# Patient Record
Sex: Male | Born: 1975 | Race: White | Hispanic: No | Marital: Married | Smoking: Current every day smoker
Health system: Southern US, Community
[De-identification: ages and names within clinical notes are randomized; demographics above are authoritative.]

## PROBLEM LIST (undated history)

## (undated) DIAGNOSIS — S065XAA Traumatic subdural hemorrhage with loss of consciousness status unknown, initial encounter: Secondary | ICD-10-CM

## (undated) DIAGNOSIS — M869 Osteomyelitis, unspecified: Secondary | ICD-10-CM

## (undated) DIAGNOSIS — S065X9A Traumatic subdural hemorrhage with loss of consciousness of unspecified duration, initial encounter: Secondary | ICD-10-CM

## (undated) DIAGNOSIS — F988 Other specified behavioral and emotional disorders with onset usually occurring in childhood and adolescence: Secondary | ICD-10-CM

## (undated) HISTORY — DX: Other specified behavioral and emotional disorders with onset usually occurring in childhood and adolescence: F98.8

## (undated) HISTORY — DX: Osteomyelitis, unspecified: M86.9

## (undated) HISTORY — DX: Traumatic subdural hemorrhage with loss of consciousness status unknown, initial encounter: S06.5XAA

## (undated) HISTORY — PX: ANTERIOR CRUCIATE LIGAMENT REPAIR: SHX115

## (undated) HISTORY — DX: Traumatic subdural hemorrhage with loss of consciousness of unspecified duration, initial encounter: S06.5X9A

---

## 2004-04-13 ENCOUNTER — Ambulatory Visit: Payer: Self-pay | Admitting: Family Medicine

## 2004-05-26 ENCOUNTER — Ambulatory Visit: Payer: Self-pay | Admitting: Family Medicine

## 2004-06-15 ENCOUNTER — Ambulatory Visit: Payer: Self-pay | Admitting: Family Medicine

## 2004-09-22 ENCOUNTER — Ambulatory Visit (HOSPITAL_COMMUNITY): Admission: RE | Admit: 2004-09-22 | Discharge: 2004-09-22 | Payer: Self-pay | Admitting: Orthopedic Surgery

## 2006-06-26 ENCOUNTER — Ambulatory Visit: Payer: Self-pay | Admitting: Family Medicine

## 2006-10-21 DIAGNOSIS — J45909 Unspecified asthma, uncomplicated: Secondary | ICD-10-CM | POA: Insufficient documentation

## 2006-11-07 ENCOUNTER — Ambulatory Visit: Payer: Self-pay | Admitting: Family Medicine

## 2006-11-07 DIAGNOSIS — F988 Other specified behavioral and emotional disorders with onset usually occurring in childhood and adolescence: Secondary | ICD-10-CM | POA: Insufficient documentation

## 2006-11-07 DIAGNOSIS — F17200 Nicotine dependence, unspecified, uncomplicated: Secondary | ICD-10-CM | POA: Insufficient documentation

## 2006-12-23 ENCOUNTER — Ambulatory Visit: Payer: Self-pay | Admitting: Family Medicine

## 2007-01-14 ENCOUNTER — Ambulatory Visit: Payer: Self-pay | Admitting: Family Medicine

## 2007-01-14 DIAGNOSIS — K602 Anal fissure, unspecified: Secondary | ICD-10-CM

## 2007-02-12 ENCOUNTER — Emergency Department (HOSPITAL_COMMUNITY): Admission: EM | Admit: 2007-02-12 | Discharge: 2007-02-12 | Payer: Self-pay | Admitting: Family Medicine

## 2007-02-18 ENCOUNTER — Ambulatory Visit: Payer: Self-pay | Admitting: Family Medicine

## 2007-02-19 DIAGNOSIS — S92209A Fracture of unspecified tarsal bone(s) of unspecified foot, initial encounter for closed fracture: Secondary | ICD-10-CM

## 2007-02-19 DIAGNOSIS — S92309A Fracture of unspecified metatarsal bone(s), unspecified foot, initial encounter for closed fracture: Secondary | ICD-10-CM

## 2007-04-07 ENCOUNTER — Telehealth (INDEPENDENT_AMBULATORY_CARE_PROVIDER_SITE_OTHER): Payer: Self-pay | Admitting: *Deleted

## 2007-04-28 ENCOUNTER — Telehealth: Payer: Self-pay | Admitting: Family Medicine

## 2007-07-24 ENCOUNTER — Telehealth: Payer: Self-pay | Admitting: *Deleted

## 2007-07-26 DIAGNOSIS — M549 Dorsalgia, unspecified: Secondary | ICD-10-CM | POA: Insufficient documentation

## 2007-07-28 ENCOUNTER — Ambulatory Visit: Payer: Self-pay | Admitting: Family Medicine

## 2008-02-17 ENCOUNTER — Ambulatory Visit: Payer: Self-pay | Admitting: Family Medicine

## 2008-02-23 ENCOUNTER — Ambulatory Visit: Payer: Self-pay | Admitting: Family Medicine

## 2008-07-16 ENCOUNTER — Ambulatory Visit: Payer: Self-pay | Admitting: Family Medicine

## 2008-09-17 ENCOUNTER — Ambulatory Visit: Payer: Self-pay | Admitting: Family Medicine

## 2008-12-13 ENCOUNTER — Telehealth: Payer: Self-pay | Admitting: Internal Medicine

## 2009-02-10 ENCOUNTER — Telehealth: Payer: Self-pay | Admitting: Family Medicine

## 2009-03-04 ENCOUNTER — Telehealth: Payer: Self-pay | Admitting: Family Medicine

## 2009-04-07 ENCOUNTER — Telehealth (INDEPENDENT_AMBULATORY_CARE_PROVIDER_SITE_OTHER): Payer: Self-pay | Admitting: *Deleted

## 2009-04-08 ENCOUNTER — Encounter: Payer: Self-pay | Admitting: Family Medicine

## 2009-05-31 ENCOUNTER — Telehealth: Payer: Self-pay | Admitting: Family Medicine

## 2009-06-29 ENCOUNTER — Telehealth: Payer: Self-pay | Admitting: Family Medicine

## 2009-08-25 ENCOUNTER — Telehealth: Payer: Self-pay | Admitting: Family Medicine
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
## 2009-11-18 ENCOUNTER — Ambulatory Visit: Payer: Self-pay | Admitting: Family Medicine

## 2009-11-18 DIAGNOSIS — M545 Low back pain: Secondary | ICD-10-CM | POA: Insufficient documentation

## 2010-02-23 ENCOUNTER — Telehealth: Payer: Self-pay | Admitting: Family Medicine

## 2010-03-21 NOTE — Medication Information (Signed)
Summary: Prior Authorization Request and Approval for Dextroamp-Amphet  Prior Authorization Request and Approval for Dextroamp-Amphet   Imported By: Maryln Gottron 04/12/2009 13:36:26  _____________________________________________________________________  External Attachment:    Type:   Image     Comment:   External Document

## 2010-03-21 NOTE — Progress Notes (Signed)
Summary: pt req refills of Adderall XR and Methylphenidate  Phone Note Refill Request Call back at Home Phone [phone]   Refills Requested: Medication #1:  ADDERALL XR 20 MG XR24H-CAP Take 1 tablet by mouth every morning   Dosage confirmed as above?Dosage Confirmed   Supply Requested: 3 months  Medication #2:  METHYLPHENIDATE HCL 10 MG  TABS take one tab at noon and one tab at 4pm   Dosage confirmed as above?Dosage Confirmed   Supply Requested: 3 months  Method Requested: Pick up at Office Initial call taken by: Lucy Antigua,  August 25, 2009 1:48 PM    Prescriptions: ADDERALL XR 20 MG XR24H-CAP (AMPHETAMINE-DEXTROAMPHETAMINE) take one tab every morning fill in two months  #30 x 0   Entered by:   Kern Reap CMA (AAMA)   Authorized by:   Roderick Pee MD   Signed by:   Kern Reap CMA (AAMA) on 08/25/2009   Method used:   Print then Give to Patient   RxID:   [id number] ADDERALL XR 20 MG XR24H-CAP (AMPHETAMINE-DEXTROAMPHETAMINE) take one tab every morning  fill in one month  #30 x 0   Entered by:   Kern Reap CMA (AAMA)   Authorized by:   Roderick Pee MD   Signed by:   Kern Reap CMA (AAMA) on 08/25/2009   Method used:   Print then Give to Patient   RxID:   [id number] ADDERALL XR 20 MG XR24H-CAP (AMPHETAMINE-DEXTROAMPHETAMINE) Take 1 tablet by mouth every morning  #30 x 0   Entered by:   Kern Reap CMA (AAMA)   Authorized by:   Roderick Pee MD   Signed by:   Kern Reap CMA (AAMA) on 08/25/2009   Method used:   Print then Give to Patient   RxID:   [id number] METHYLPHENIDATE HCL 10 MG TABS (METHYLPHENIDATE HCL) take one tab at 12 and one tab at 4pm fill in two months  #60 x 0   Entered by:   Kern Reap CMA (AAMA)   Authorized by:   Roderick Pee MD   Signed by:   Kern Reap CMA (AAMA) on 08/25/2009   Method used:   Print then Give to Patient   RxID:   [id number] METHYLPHENIDATE HCL 10 MG TABS (METHYLPHENIDATE HCL)  take one tab at noon and one tab at 4pm  fill in one month  #60 x 0   Entered by:   Kern Reap CMA (AAMA)   Authorized by:   Roderick Pee MD   Signed by:   Kern Reap CMA (AAMA) on 08/25/2009   Method used:   Print then Give to Patient   RxID:   [id number] METHYLPHENIDATE HCL 10 MG  TABS (METHYLPHENIDATE HCL) take one tab at noon and one tab at 4pm  #60 x 0   Entered by:   Kern Reap CMA (AAMA)   Authorized by:   Roderick Pee MD   Signed by:   Kern Reap CMA (AAMA) on 08/25/2009   Method used:   Print then Give to Patient   RxID:   [id number]

## 2010-03-21 NOTE — Progress Notes (Signed)
Summary: REQ FOR REFILL ON MED   Phone Note Call from Patient   Caller: Patient    [phone] Reason for Call: Refill Medication Summary of Call: Pt called in to req a refill on meds:  METHYLPHENIDATE HCL 10 MG  TABS  -and-   ADDERALL XR 20 MG ..... Pt adv that he can be reached at [phone] when same is ready.  Initial call taken by: Debbra Riding,  May 31, 2009 4:47 PM  Follow-up for Phone Call        rx is ready for pick up Follow-up by: Kern Reap CMA Duncan Dull),  June 01, 2009 8:42 AM    Prescriptions: ADDERALL XR 20 MG XR24H-CAP (AMPHETAMINE-DEXTROAMPHETAMINE) take one tab every morning fill in two months  #30 x 0   Entered by:   Kern Reap CMA (AAMA)   Authorized by:   Roderick Pee MD   Signed by:   Kern Reap CMA (AAMA) on 05/31/2009   Method used:   Print then Give to Patient   RxID:   [id number] ADDERALL XR 20 MG XR24H-CAP (AMPHETAMINE-DEXTROAMPHETAMINE) take one tab every morning  fill in one month  #30 x 0   Entered by:   Kern Reap CMA (AAMA)   Authorized by:   Roderick Pee MD   Signed by:   Kern Reap CMA (AAMA) on 05/31/2009   Method used:   Print then Give to Patient   RxID:   [id number] ADDERALL XR 20 MG XR24H-CAP (AMPHETAMINE-DEXTROAMPHETAMINE) Take 1 tablet by mouth every morning  #30 x 0   Entered by:   Kern Reap CMA (AAMA)   Authorized by:   Roderick Pee MD   Signed by:   Kern Reap CMA (AAMA) on 05/31/2009   Method used:   Print then Give to Patient   RxID:   [id number] METHYLPHENIDATE HCL 10 MG TABS (METHYLPHENIDATE HCL) take one tab at 12 and one tab at 4pm fill in two months  #60 x 0   Entered by:   Kern Reap CMA (AAMA)   Authorized by:   Roderick Pee MD   Signed by:   Kern Reap CMA (AAMA) on 05/31/2009   Method used:   Print then Give to Patient   RxID:   [id number] METHYLPHENIDATE HCL 10 MG TABS (METHYLPHENIDATE HCL) take one tab at noon and one tab at 4pm  fill in one month  #60 x  0   Entered by:   Kern Reap CMA (AAMA)   Authorized by:   Roderick Pee MD   Signed by:   Kern Reap CMA (AAMA) on 05/31/2009   Method used:   Print then Give to Patient   RxID:   [id number] METHYLPHENIDATE HCL 10 MG  TABS (METHYLPHENIDATE HCL) take one tab at noon and one tab at 4pm  #60 x 0   Entered by:   Kern Reap CMA (AAMA)   Authorized by:   Roderick Pee MD   Signed by:   Kern Reap CMA (AAMA) on 05/31/2009   Method used:   Print then Give to Patient   RxID:   [id number]

## 2010-03-21 NOTE — Progress Notes (Signed)
Summary: REFILL ON MEDS  Phone Note Call from Patient   Caller: Patient [phone] Reason for Call: Refill Medication Summary of Call: Pt called to req a refill on meds: Methylphenidate Hcl 10 Mg Tabs  and  Adderall Xr 20 Mg.... Pt adv that he can be reached at [phone] when same is ready for p  Initial call taken by: Debbra Riding,  March 04, 2009 12:25 PM  Follow-up for Phone Call        rx ready for pick up.  patient is aware Follow-up by: Kern Reap CMA Duncan Dull),  March 07, 2009 12:28 PM    New/Updated Medications: METHYLPHENIDATE HCL 10 MG  TABS (METHYLPHENIDATE HCL) take one tab at noon and one tab at 4pm METHYLPHENIDATE HCL 10 MG TABS (METHYLPHENIDATE HCL) take one tab at noon and one tab at 4pm  fill in one month METHYLPHENIDATE HCL 10 MG TABS (METHYLPHENIDATE HCL) take one tab at 12 and one tab at 4pm fill in two months ADDERALL XR 20 MG XR24H-CAP (AMPHETAMINE-DEXTROAMPHETAMINE) take one tab every morning  fill in one month ADDERALL XR 20 MG XR24H-CAP (AMPHETAMINE-DEXTROAMPHETAMINE) take one tab every morning fill in two months Prescriptions: ADDERALL XR 20 MG XR24H-CAP (AMPHETAMINE-DEXTROAMPHETAMINE) take one tab every morning fill in two months  #30 x 0   Entered by:   Kern Reap CMA (AAMA)   Authorized by:   Roderick Pee MD   Signed by:   Kern Reap CMA (AAMA) on 03/07/2009   Method used:   Print then Give to Patient   RxID:   [id number] ADDERALL XR 20 MG XR24H-CAP (AMPHETAMINE-DEXTROAMPHETAMINE) take one tab every morning  fill in one month  #30 x 0   Entered by:   Kern Reap CMA (AAMA)   Authorized by:   Roderick Pee MD   Signed by:   Kern Reap CMA (AAMA) on 03/07/2009   Method used:   Print then Give to Patient   RxID:   [id number] ADDERALL XR 20 MG XR24H-CAP (AMPHETAMINE-DEXTROAMPHETAMINE) Take 1 tablet by mouth every morning  #30 x 0   Entered by:   Kern Reap CMA (AAMA)   Authorized by:   Roderick Pee MD   Signed  by:   Kern Reap CMA (AAMA) on 03/07/2009   Method used:   Print then Give to Patient   RxID:   [id number] METHYLPHENIDATE HCL 10 MG TABS (METHYLPHENIDATE HCL) take one tab at 12 and one tab at 4pm fill in two months  #60 x 0   Entered by:   Kern Reap CMA (AAMA)   Authorized by:   Roderick Pee MD   Signed by:   Kern Reap CMA (AAMA) on 03/07/2009   Method used:   Print then Give to Patient   RxID:   [id number] METHYLPHENIDATE HCL 10 MG TABS (METHYLPHENIDATE HCL) take one tab at noon and one tab at 4pm  fill in one month  #60 x 0   Entered by:   Kern Reap CMA (AAMA)   Authorized by:   Roderick Pee MD   Signed by:   Kern Reap CMA (AAMA) on 03/07/2009   Method used:   Print then Give to Patient   RxID:   [id number] METHYLPHENIDATE HCL 10 MG  TABS (METHYLPHENIDATE HCL) take one tab at noon and one tab at 4pm  #60 x 0   Entered by:   Kern Reap CMA (AAMA)   Authorized by:   Roderick Pee MD  Signed by:   Kern Reap CMA (AAMA) on 03/07/2009   Method used:   Print then Give to Patient   RxID:   [id number]

## 2010-03-21 NOTE — Progress Notes (Signed)
Summary: PRIOR AUTHORIZATION  Phone Note Call from Patient   Caller: Patient [phone] Reason for Call: Refill Medication Summary of Call: Pt called to check on status of Prior Authorization for his meds :  Methylphenidate Hcl 10 Mg Tabs  and  Adderall Xr 20 Mg..... Pt adv that he checked with [employer] and they are telling him that they faxed the form to [facility] but they have not had any response ref to same...Marland KitchenMarland Kitchen Pt adv that he is going out of town and would like to p/u RX prior to departure, if possible.   Initial call taken by: Debbra Riding,  April 07, 2009 9:38 AM  Follow-up for Phone Call        No forms on this pt in Triage .  Does Dr. Tawanna Cooler have them?  Please have pt ask to have forms resent. Follow-up by: Lynann Beaver CMA,  April 07, 2009 10:00 AM  Additional Follow-up for Phone Call Additional follow up Details #1::        Phone Call Completed----Forms resent via fax.... Received and placed in Triage folder for delivery.  Additional Follow-up by: Debbra Riding,  April 07, 2009 11:01 AM     Appended Document: PRIOR AUTHORIZATION PT IS AWARE PA HAS BEEN APPROVED UNTIL 04-07-2010 [facility] IS ALSO AWARE.

## 2010-03-21 NOTE — Assessment & Plan Note (Signed)
Summary: MED CK / REFILL // RS   Vital Signs:  Patient profile:   35 year old male Height:      74 inches Weight:      210 pounds BMI:     27.06 Temp:     98.2 degrees F oral BP sitting:   120 / 80  (left arm) Cuff size:   regular  Vitals Entered By: Kern Reap CMA Duncan Dull) (November 18, 2009 11:30 AM) CC: follow-up visit Is Patient Diabetic? No   CC:  follow-up visit.  History of Present Illness:  Todd Cobb is a 35 year old male, smoker, the comes in today for evaluation of ADD/.  Low back pain.  For ADD.  He takes Adderall 20 milligrams long-acting in the morning and 10 mg at noon of the methylphenidate and 10 mg at 4 p.m. with this combination.  He functions fairly normally.  He also has a history of chronic low back pain.  We x-rayed his back about 15 months ago x-rays were negative.  His back pain comes and goes.  He continues to use an occasional Flexeril and Vicodin at bedtime.  I explained since he was having recurrent pain we would need to get him involved in a formal physical therapy program.  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  Past Medical History: Reviewed history from 01/14/2007 and no changes required. 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 Family History Diabetes 1st degree relative Family History High cholesterol Family History Hypertension  Social History: Reviewed history from 07/16/2008 and no changes required. Married Current Smoker Alcohol use-no moved home with mom and dad  Review of Systems      See HPI  Physical Exam  General:  Well-developed,well-nourished,in no acute distress;  alert,appropriate and cooperative throughout examination Psych:  Cognition and judgment appear intact. Alert and cooperative with normal attention span and concentration. No apparent delusions, illusions, hallucinations   Impression & Recommendations:  Problem # 1:  ADD (ICD-314.00) Assessment Improved  Problem # 2:  LOW BACK PAIN, CHRONIC (ICD-724.2) Assessment: Unchanged  His updated medication list for this problem includes:    Hydrocodone-acetaminophen 7.5-750 Mg Tabs (Hydrocodone-acetaminophen) .Marland Kitchen... Take one tab at bedtime    Flexeril 10 Mg Tabs (Cyclobenzaprine hcl) .Marland Kitchen... Take one tab by mouth at bedtime  Orders: Physical Therapy Referral (PT)  Complete Medication List: 1)  Methylphenidate Hcl 10 Mg Tabs (Methylphenidate hcl) .... Take one tab at noon and one tab at 4pm 2)  Adderall Xr 20 Mg Xr24h-cap (Amphetamine-dextroamphetamine) .... Take 1 tablet by mouth every morning 3)  Hydrocodone-acetaminophen 7.5-750 Mg Tabs (Hydrocodone-acetaminophen) .... Take one tab at bedtime 4)  Methylphenidate Hcl 10 Mg Tabs (Methylphenidate hcl) .... Take one tab at noon and one tab at 4pm  fill in one month 5)  Methylphenidate Hcl 10 Mg Tabs (Methylphenidate hcl) .... Take one tab at 12 and one tab at 4pm fill in two months 6)  Adderall Xr 20 Mg Xr24h-cap (Amphetamine-dextroamphetamine) .... Take one tab every morning  fill in one month 7)  Adderall Xr 20 Mg Xr24h-cap (Amphetamine-dextroamphetamine) .... Take one tab every morning fill in two months 8)  Flexeril 10 Mg Tabs (Cyclobenzaprine hcl) .... Take one  tab by mouth at bedtime  Patient Instructions: 1)  continue the long-acting 20-mg Adderall in the morning, and the 10 mg of methylphenidate twice daily. 2)  We will   set up for a physical therapy consult to evaluate the low back pain.  It can take a half of Flexeril and a half of Vicodin at bedtime as needed for severe pain.  Also Motrin 600 mg twice daily with food 3)  Please schedule  a follow-up appointment in 1 year. Prescriptions: FLEXERIL 10 MG TABS (CYCLOBENZAPRINE HCL) take one tab by mouth at bedtime  #30 x 1   Entered and Authorized by:   Roderick Pee MD   Signed by:   Roderick Pee MD on 11/18/2009   Method used:   Print then Give to Patient   RxID:   [id number] ADDERALL XR 20 MG XR24H-CAP (AMPHETAMINE-DEXTROAMPHETAMINE) take one tab every morning fill in two months  #30 x 0   Entered and Authorized by:   Roderick Pee MD   Signed by:   Roderick Pee MD on 11/18/2009   Method used:   Print then Give to Patient   RxID:   [id number] ADDERALL XR 20 MG XR24H-CAP (AMPHETAMINE-DEXTROAMPHETAMINE) take one tab every morning  fill in one month  #30 x 0   Entered and Authorized by:   Roderick Pee MD   Signed by:   Roderick Pee MD on 11/18/2009   Method used:   Print then Give to Patient   RxID:   [id number] METHYLPHENIDATE HCL 10 MG TABS (METHYLPHENIDATE HCL) take one tab at 12 and one tab at 4pm fill in two months  #60 x 0   Entered and Authorized by:   Roderick Pee MD   Signed by:   Roderick Pee MD on 11/18/2009   Method used:   Print then Give to Patient   RxID:   [id number] METHYLPHENIDATE HCL 10 MG TABS (METHYLPHENIDATE HCL) take one tab at noon and one tab at 4pm  fill in one month  #60 x 0   Entered and Authorized by:   Roderick Pee MD   Signed by:   Roderick Pee MD on 11/18/2009   Method used:   Print then Give to Patient   RxID:   [id number] HYDROCODONE-ACETAMINOPHEN 7.5-750 MG TABS (HYDROCODONE-ACETAMINOPHEN) take one tab at bedtime  #30 x 1   Entered and Authorized by:   Roderick Pee MD   Signed by:   Roderick Pee MD on 11/18/2009   Method used:   Print then Give to Patient   RxID:   [id number] ADDERALL XR 20 MG XR24H-CAP (AMPHETAMINE-DEXTROAMPHETAMINE) Take 1 tablet by mouth every morning  #30 x 0   Entered and Authorized by:   Roderick Pee MD   Signed by:   Roderick Pee MD on 11/18/2009    Method used:   Print then Give to Patient   RxID:   [id number] METHYLPHENIDATE HCL 10 MG  TABS (METHYLPHENIDATE HCL) take one tab at noon and one tab at 4pm  #60 x 0   Entered and Authorized by:   Roderick Pee MD   Signed by:   Roderick Pee MD on 11/18/2009   Method used:   Print then Give to Patient   RxID:   [id number]

## 2010-03-21 NOTE — Progress Notes (Signed)
Summary: REQ FOR REFILL (Hydrocodone-Acetaminophen)  Phone Note Refill Request Message from:  Fax from Pharmacy on Jun 29, 2009 11:49 AM  Refills Requested: Medication #1:  HYDROCODONE-ACETAMINOPHEN 7.5-750 MG TABS take one tab at bedtime   Notes: Qty #30 - [facility] [address], [place] Initial call taken by: Kern Reap CMA Duncan Dull),  Jun 29, 2009 12:21 PM  Follow-up for Phone Call        Vicodin ES dispense 30 tablets, directions one nightly p.r.n. for severe back pain, refills x 1 Follow-up by: Roderick Pee MD,  Jun 29, 2009 12:15 PM    Prescriptions: HYDROCODONE-ACETAMINOPHEN 7.5-750 MG TABS (HYDROCODONE-ACETAMINOPHEN) take one tab at bedtime  #30 x 1   Entered by:   Kern Reap CMA (AAMA)   Authorized by:   Roderick Pee MD   Signed by:   Kern Reap CMA (AAMA) on 06/29/2009   Method used:   Telephoned to ...       [facility] [phone]* (retail)       [address] [place]       [place], [place]  [address]       Ph: [phone] or [phone]       Fax: [phone]   RxID:   [id number]

## 2010-03-23 NOTE — Progress Notes (Signed)
Summary: new rx  Phone Note Refill Request Call back at Home Phone [phone] Message from:  Patient  Refills Requested: Medication #1:  ADDERALL XR 20 MG XR24H-CAP Take 1 tablet by mouth every morning  Medication #2:  METHYLPHENIDATE HCL 10 MG  TABS take one tab at noon and one tab at 4pm pt needs new rxs  Initial call taken by: Heron Sabins,  February 23, 2010 12:43 PM    Prescriptions: METHYLPHENIDATE HCL 10 MG  TABS (METHYLPHENIDATE HCL) take one tab at noon and one tab at 4pm  #60 x 0   Entered by:   Kern Reap CMA (AAMA)   Authorized by:   Roderick Pee MD   Signed by:   Kern Reap CMA (AAMA) on 02/24/2010   Method used:   Print then Give to Patient   RxID:   [id number] METHYLPHENIDATE HCL 10 MG TABS (METHYLPHENIDATE HCL) take one tab at 12 and one tab at 4pm fill in two months  #60 x 0   Entered by:   Kern Reap CMA (AAMA)   Authorized by:   Roderick Pee MD   Signed by:   Kern Reap CMA (AAMA) on 02/24/2010   Method used:   Print then Give to Patient   RxID:   [id number] METHYLPHENIDATE HCL 10 MG TABS (METHYLPHENIDATE HCL) take one tab at noon and one tab at 4pm  fill in one month  #60 x 0   Entered by:   Kern Reap CMA (AAMA)   Authorized by:   Roderick Pee MD   Signed by:   Kern Reap CMA (AAMA) on 02/24/2010   Method used:   Print then Give to Patient   RxID:   [id number] ADDERALL XR 20 MG XR24H-CAP (AMPHETAMINE-DEXTROAMPHETAMINE) take one tab every morning fill in two months  #30 x 0   Entered by:   Kern Reap CMA (AAMA)   Authorized by:   Roderick Pee MD   Signed by:   Kern Reap CMA (AAMA) on 02/24/2010   Method used:   Print then Give to Patient   RxID:   [id number] ADDERALL XR 20 MG XR24H-CAP (AMPHETAMINE-DEXTROAMPHETAMINE) take one tab every morning  fill in one month  #30 x 0   Entered by:   Kern Reap CMA (AAMA)   Authorized by:   Roderick Pee MD   Signed by:   Kern Reap CMA (AAMA) on  02/24/2010   Method used:   Print then Give to Patient   RxID:   [id number] ADDERALL XR 20 MG XR24H-CAP (AMPHETAMINE-DEXTROAMPHETAMINE) Take 1 tablet by mouth every morning  #30 x 0   Entered by:   Kern Reap CMA (AAMA)   Authorized by:   Roderick Pee MD   Signed by:   Kern Reap CMA (AAMA) on 02/24/2010   Method used:   Print then Give to Patient   RxID:   [id number]

## 2010-05-12 ENCOUNTER — Telehealth: Payer: Self-pay | Admitting: *Deleted

## 2010-05-12 NOTE — Telephone Encounter (Signed)
patient  Is calling for refill of adderall and ritalin

## 2010-05-15 MED ORDER — METHYLPHENIDATE HCL 10 MG PO TABS: 10.0000 mg | ORAL_TABLET | Freq: Two times a day (BID) | ORAL | Status: DC

## 2010-05-15 MED ORDER — AMPHETAMINE-DEXTROAMPHET ER 20 MG PO CP24: 20.0000 mg | ORAL_CAPSULE | ORAL | Status: DC

## 2010-08-09 ENCOUNTER — Telehealth: Payer: Self-pay | Admitting: *Deleted

## 2010-08-09 MED ORDER — AMPHETAMINE-DEXTROAMPHET ER 20 MG PO CP24: 20.0000 mg | ORAL_CAPSULE | ORAL | Status: DC

## 2010-08-09 MED ORDER — METHYLPHENIDATE HCL 10 MG PO TABS: 10.0000 mg | ORAL_TABLET | Freq: Two times a day (BID) | ORAL | Status: DC

## 2010-08-09 NOTE — Telephone Encounter (Signed)
Rx refill of adderall and methylphenidate (dosage not given).  Please call when ready for pick up.

## 2010-08-09 NOTE — Telephone Encounter (Signed)
Left message on machine rx ready for pick up  

## 2010-09-28 ENCOUNTER — Ambulatory Visit (INDEPENDENT_AMBULATORY_CARE_PROVIDER_SITE_OTHER): Payer: BC Managed Care – PPO | Admitting: Family Medicine

## 2010-09-28 ENCOUNTER — Encounter: Payer: Self-pay | Admitting: Family Medicine

## 2010-09-28 VITALS — BP 120/78 | Temp 98.2°F | Ht 72.0 in | Wt 224.0 lb

## 2010-09-28 DIAGNOSIS — F988 Other specified behavioral and emotional disorders with onset usually occurring in childhood and adolescence: Secondary | ICD-10-CM

## 2010-09-28 MED ORDER — METHYLPHENIDATE HCL 10 MG PO TABS: 10.0000 mg | ORAL_TABLET | Freq: Two times a day (BID) | ORAL | Status: DC

## 2010-09-28 MED ORDER — AMPHETAMINE-DEXTROAMPHET ER 20 MG PO CP24: 20.0000 mg | ORAL_CAPSULE | ORAL | Status: DC

## 2010-09-28 MED ORDER — METHYLPHENIDATE HCL 10 MG PO TABS: ORAL_TABLET | ORAL | Status: DC

## 2010-09-28 NOTE — Progress Notes (Signed)
  Subjective:    Patient ID: Todd Cobb, male    DOB: 11-Nov-1975, 35 y.o.   MRN: [id number]  Todd Cobb is a 35 year old, married male, smoker, who comes in today to renew his prescriptions for ADD.  There was a change in insurance company and there was a gap in getting his medication failures currently out of his medications.  He takes a long-acting Adderall 20 mg daily, along with 10 mg of plain Ritalin b.i.d.  With this medication is able to function at work.    Review of Systems    General and psychiatric review of systems otherwise negative Objective:   Physical Exam Well-developed well-nourished, male in no acute distress       Assessment & Plan:  ADD renew medications

## 2010-09-28 NOTE — Patient Instructions (Signed)
Continued the 20-mg long-acting tablet in the morning, and the 10-mg tablet twice daily.  When your two weeks from being out of your third prescription call for refills

## 2010-11-16 ENCOUNTER — Telehealth: Payer: Self-pay | Admitting: Family Medicine

## 2010-11-16 NOTE — Telephone Encounter (Signed)
Refill Adderall and Methyphenidate. Thanks.

## 2010-11-17 NOTE — Telephone Encounter (Signed)
Pt walked in to check on the status of his 2 rxs. i explained to him that Dr Tawanna Cooler and Fleet Contras are both out and we will try to get it refilled on Monday. Thanks.

## 2010-11-22 NOTE — Telephone Encounter (Signed)
Left message on machine for patient  To return our call. Last fill for adderall xr 20 and adderall 10 was 11/18/10 .

## 2010-11-23 NOTE — Telephone Encounter (Signed)
patient  Is aware 

## 2010-12-13 ENCOUNTER — Telehealth: Payer: Self-pay | Admitting: *Deleted

## 2010-12-13 DIAGNOSIS — F988 Other specified behavioral and emotional disorders with onset usually occurring in childhood and adolescence: Secondary | ICD-10-CM

## 2010-12-13 NOTE — Telephone Encounter (Signed)
patient  Is calling because he would like an early refill for his Rialin and Adderall because he is going out of town.

## 2010-12-14 MED ORDER — METHYLPHENIDATE HCL 10 MG PO TABS: 10.0000 mg | ORAL_TABLET | Freq: Two times a day (BID) | ORAL | Status: DC

## 2010-12-14 MED ORDER — AMPHETAMINE-DEXTROAMPHET ER 20 MG PO CP24: 20.0000 mg | ORAL_CAPSULE | Freq: Two times a day (BID) | ORAL | Status: DC

## 2010-12-14 MED ORDER — AMPHETAMINE-DEXTROAMPHET ER 20 MG PO CP24: 20.0000 mg | ORAL_CAPSULE | ORAL | Status: DC

## 2010-12-14 NOTE — Telephone Encounter (Signed)
ok 

## 2010-12-14 NOTE — Telephone Encounter (Signed)
rx ready for pick up and patient is aware  

## 2011-02-02 ENCOUNTER — Encounter: Payer: Self-pay | Admitting: Family Medicine

## 2011-02-02 ENCOUNTER — Ambulatory Visit (INDEPENDENT_AMBULATORY_CARE_PROVIDER_SITE_OTHER): Payer: BC Managed Care – PPO | Admitting: Family Medicine

## 2011-02-02 DIAGNOSIS — J029 Acute pharyngitis, unspecified: Secondary | ICD-10-CM

## 2011-02-02 DIAGNOSIS — J069 Acute upper respiratory infection, unspecified: Secondary | ICD-10-CM

## 2011-02-02 DIAGNOSIS — F17200 Nicotine dependence, unspecified, uncomplicated: Secondary | ICD-10-CM

## 2011-02-02 MED ORDER — HYDROCODONE-HOMATROPINE 5-1.5 MG/5ML PO SYRP: ORAL_SOLUTION | ORAL | Status: DC

## 2011-02-02 MED ORDER — VARENICLINE TARTRATE 1 MG PO TABS: ORAL_TABLET | ORAL | Status: DC

## 2011-02-02 NOTE — Patient Instructions (Signed)
Drink lots of water.  Run a vaporizer or humidifier in your bedroom at night.  Hydromet one half to 1 teaspoon at bedtime p.r.n.  Stop smoking completely.  Return p.r.n.

## 2011-02-02 NOTE — Progress Notes (Signed)
  Subjective:    Patient ID: Todd Cobb, male    DOB: 1976/02/02, 35 y.o.   MRN: [id number]  HPI [name] is a 35 year old male, married, smoker, who comes in today with a 3-day history of head congestion, sore throat, minimal cough.  No fever, no sputum production   Review of Systems General and infectious review of systems negative except he smokes a half a pack of cigarettes a day    Objective:   Physical Exam Well-developed well-nourished, male in no acute distress.  HEENT are negative.  Neck is supple.  No adenopathy.  Lungs are clear.  Rapid strep negative       Assessment & Plan:  Viral URI.  Plan treat symptomatically, DC, and stop smoking completely

## 2011-02-12 ENCOUNTER — Telehealth: Payer: Self-pay | Admitting: *Deleted

## 2011-02-12 NOTE — Telephone Encounter (Signed)
Patient is calling because he would like a new prescription for his Adderall.  He would like the prescription no to state "fill in one month or fill in two months" because he has a hard time having them filled by the pharmacy.  Is this okay to fill?

## 2011-02-14 NOTE — Telephone Encounter (Signed)
Fleet Contras please call and explained to him that we have to fill the prescriptions as directed

## 2011-02-15 NOTE — Telephone Encounter (Signed)
Left message on machine for patient

## 2011-03-08 ENCOUNTER — Ambulatory Visit: Payer: BC Managed Care – PPO | Admitting: Family Medicine

## 2011-03-08 ENCOUNTER — Other Ambulatory Visit: Payer: Self-pay | Admitting: *Deleted

## 2011-03-08 DIAGNOSIS — F988 Other specified behavioral and emotional disorders with onset usually occurring in childhood and adolescence: Secondary | ICD-10-CM

## 2011-03-08 MED ORDER — AMPHETAMINE-DEXTROAMPHET ER 20 MG PO CP24: 20.0000 mg | ORAL_CAPSULE | ORAL | Status: DC

## 2011-03-08 MED ORDER — AMPHETAMINE-DEXTROAMPHET ER 20 MG PO CP24: 20.0000 mg | ORAL_CAPSULE | ORAL | Status: AC

## 2011-03-08 MED ORDER — METHYLPHENIDATE HCL 20 MG PO TABS: 20.0000 mg | ORAL_TABLET | Freq: Two times a day (BID) | ORAL | Status: DC

## 2011-03-08 NOTE — Telephone Encounter (Signed)
Patient is calling for refills of Adderall and Ritalin.  He would like his Ritalin increased to 20mg  Bid?

## 2011-03-08 NOTE — Telephone Encounter (Signed)
ok 

## 2011-05-22 ENCOUNTER — Telehealth: Payer: Self-pay | Admitting: Family Medicine

## 2011-05-22 NOTE — Telephone Encounter (Signed)
Pt needs new rx methylphenidate 20mg  twice a day and generic adderall xr 20 mg once a day. Pt is requesting 3 months rx's on both meds

## 2011-05-23 NOTE — Telephone Encounter (Signed)
Pt called to check on status of refill for Methylphenidate and generic adderall. Pt is req 3 month supply on both meds.

## 2011-05-23 NOTE — Telephone Encounter (Signed)
Refills too early.  Okay to fill next week

## 2011-05-31 MED ORDER — METHYLPHENIDATE HCL 20 MG PO TABS: 20.0000 mg | ORAL_TABLET | Freq: Two times a day (BID) | ORAL | Status: DC

## 2011-05-31 NOTE — Telephone Encounter (Signed)
Left message for patient to pick up rx

## 2011-06-04 MED ORDER — AMPHETAMINE-DEXTROAMPHET ER 20 MG PO CP24: 20.0000 mg | ORAL_CAPSULE | ORAL | Status: AC

## 2011-06-04 NOTE — Telephone Encounter (Signed)
Addended by: Kern Reap B on: 06/04/2011 09:47 AM   Modules accepted: Orders

## 2011-08-08 ENCOUNTER — Telehealth: Payer: Self-pay | Admitting: Family Medicine

## 2011-08-08 NOTE — Telephone Encounter (Signed)
Pt has fluid on elbow, possible from a bone shard from previous injury yrs ago. Pt req ov with Dr Tawanna Cooler tomorrow. Hard to sleep, tender to touch.

## 2011-08-08 NOTE — Telephone Encounter (Signed)
appt made    Pt aware

## 2011-08-08 NOTE — Telephone Encounter (Signed)
Okay to work in at 8:15  

## 2011-08-09 ENCOUNTER — Encounter: Payer: Self-pay | Admitting: Family Medicine

## 2011-08-09 ENCOUNTER — Ambulatory Visit (INDEPENDENT_AMBULATORY_CARE_PROVIDER_SITE_OTHER): Payer: BC Managed Care – PPO | Admitting: Family Medicine

## 2011-08-09 VITALS — BP 110/80 | Temp 97.8°F | Wt 229.0 lb

## 2011-08-09 DIAGNOSIS — M7022 Olecranon bursitis, left elbow: Secondary | ICD-10-CM | POA: Insufficient documentation

## 2011-08-09 DIAGNOSIS — M7020 Olecranon bursitis, unspecified elbow: Secondary | ICD-10-CM

## 2011-08-09 NOTE — Progress Notes (Signed)
  Subjective:    Patient ID: Todd Cobb, male    DOB: 1976-01-25, 36 y.o.   MRN: [id number]  HPI Todd Cobb is a 36 year old married male smoker who comes in today for evaluation of swelling of his left elbow  He said about 2-3 weeks ago he began to have swelling of his left elbow no history of trauma. General and neuromuscular review of systems otherwise negative   Review of Systems    general and neuromuscular review of systems otherwise negative Objective:   Physical Exam  Well-developed well-nourished male in no acute distress examination of left arm shows a golf ball size olecranon bursa. Olecranon bursitis plan elevation ice splint Motrin or so consult when necessary      Assessment & Plan:  Olecranon bursitis plan elevation ice splint Motrin 600 twice a day or through consult when necessary

## 2011-08-09 NOTE — Patient Instructions (Signed)
Splint elevation ice Motrin return when necessary

## 2011-08-29 ENCOUNTER — Other Ambulatory Visit: Payer: Self-pay | Admitting: Family Medicine

## 2011-08-29 DIAGNOSIS — F988 Other specified behavioral and emotional disorders with onset usually occurring in childhood and adolescence: Secondary | ICD-10-CM

## 2011-08-29 DIAGNOSIS — M25522 Pain in left elbow: Secondary | ICD-10-CM

## 2011-08-29 NOTE — Telephone Encounter (Signed)
Pt needs 3 months separate rx for generic adderall xr 20 mg and methylphenidate 20 mg twice a day.

## 2011-08-30 MED ORDER — METHYLPHENIDATE HCL 20 MG PO TABS: 20.0000 mg | ORAL_TABLET | Freq: Two times a day (BID) | ORAL | Status: DC

## 2011-08-30 MED ORDER — AMPHETAMINE-DEXTROAMPHET ER 20 MG PO CP24: 20.0000 mg | ORAL_CAPSULE | ORAL | Status: DC

## 2011-08-30 NOTE — Telephone Encounter (Signed)
Rx ready for pick up. Patient is aware.  Patient is still having some pain with his elbow.  Should he come for an office visit or go to ortho?

## 2011-08-31 NOTE — Telephone Encounter (Signed)
Ortho referral sent 

## 2011-11-01 ENCOUNTER — Ambulatory Visit (INDEPENDENT_AMBULATORY_CARE_PROVIDER_SITE_OTHER): Payer: BC Managed Care – PPO | Admitting: Family Medicine

## 2011-11-01 ENCOUNTER — Encounter: Payer: Self-pay | Admitting: Family Medicine

## 2011-11-01 VITALS — BP 110/70 | Temp 97.7°F | Wt 224.0 lb

## 2011-11-01 DIAGNOSIS — R0789 Other chest pain: Secondary | ICD-10-CM | POA: Insufficient documentation

## 2011-11-01 MED ORDER — TRAMADOL HCL 50 MG PO TABS: 50.0000 mg | ORAL_TABLET | Freq: Three times a day (TID) | ORAL | Status: AC | PRN
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
## 2011-11-01 NOTE — Patient Instructions (Signed)
Motrin 600 mg twice daily with food  Tramadol one half to one tablet at bedtime when necessary for pain  Avoid any major exercise or riding her bike until your pain free

## 2011-11-26 ENCOUNTER — Other Ambulatory Visit: Payer: Self-pay | Admitting: Family Medicine

## 2011-11-26 ENCOUNTER — Telehealth: Payer: Self-pay | Admitting: Family Medicine

## 2011-11-26 DIAGNOSIS — F988 Other specified behavioral and emotional disorders with onset usually occurring in childhood and adolescence: Secondary | ICD-10-CM

## 2011-11-26 MED ORDER — AMPHETAMINE-DEXTROAMPHET ER 20 MG PO CP24: 20.0000 mg | ORAL_CAPSULE | ORAL | Status: DC

## 2011-11-26 MED ORDER — METHYLPHENIDATE HCL 20 MG PO TABS: 20.0000 mg | ORAL_TABLET | Freq: Two times a day (BID) | ORAL | Status: DC

## 2011-11-26 NOTE — Telephone Encounter (Signed)
Prescription for the 20 mg long-acting and the 20 mg short acting were given for 3 months patient was instructed in the future he must call 2 weeks from being out of his third prescription

## 2011-11-26 NOTE — Telephone Encounter (Signed)
Pt last seen 11/01/11.  Pls advise.

## 2011-11-26 NOTE — Telephone Encounter (Signed)
Pt called req refill of methylphenidate (RITALIN) 20 MG tablet and amphetamine-dextroamphetamine (ADDERALL XR) 20 MG 24 hr capsule. Pls call when ready for pick up. Pt req to pick up today if possible.

## 2012-02-04 ENCOUNTER — Encounter: Payer: Self-pay | Admitting: Family Medicine

## 2012-02-04 ENCOUNTER — Ambulatory Visit (INDEPENDENT_AMBULATORY_CARE_PROVIDER_SITE_OTHER): Payer: BC Managed Care – PPO | Admitting: Family Medicine

## 2012-02-04 VITALS — BP 130/80 | Temp 98.0°F | Wt 223.0 lb

## 2012-02-04 DIAGNOSIS — J069 Acute upper respiratory infection, unspecified: Secondary | ICD-10-CM

## 2012-02-04 DIAGNOSIS — F17200 Nicotine dependence, unspecified, uncomplicated: Secondary | ICD-10-CM

## 2012-02-04 NOTE — Patient Instructions (Signed)
Drink lots of fluids  No smoking  Hydromet 1/2-1 teaspoon 3 times a day when necessary for cough return when necessary

## 2012-02-04 NOTE — Progress Notes (Signed)
  Subjective:    Patient ID: Todd Cobb, male    DOB: 17-Dec-1975, 36 y.o.   MRN: [id number]  HPI Todd Cobb is a 36 year old male smoker,,,, last cigarette 4 days ago,,,,,,, who comes in with a four-day history of head congestion sore throat and cough.   Review of Systems Review of systems negative    Objective:   Physical Exam  Well-developed well-nourished male in no acute distress HEENT negative neck was supple no adenopathy lungs are clear      Assessment & Plan:  Viral syndrome plan treat symptomatically  Tobacco abuse encouraged to stop smoking completely

## 2012-02-19 ENCOUNTER — Other Ambulatory Visit: Payer: Self-pay | Admitting: Family Medicine

## 2012-02-19 DIAGNOSIS — F988 Other specified behavioral and emotional disorders with onset usually occurring in childhood and adolescence: Secondary | ICD-10-CM

## 2012-02-19 NOTE — Telephone Encounter (Signed)
Pt needs refill of: amphetamine-dextroamphetamine (ADDERALL XR) 20 MG 24 hr capsule methylphenidate (RITALIN) 20 MG tablet Would like to pick up Monday, 02/25/12.  Thank you!

## 2012-02-21 MED ORDER — METHYLPHENIDATE HCL 20 MG PO TABS: 20.0000 mg | ORAL_TABLET | Freq: Two times a day (BID) | ORAL | Status: DC

## 2012-02-21 MED ORDER — AMPHETAMINE-DEXTROAMPHET ER 20 MG PO CP24: 20.0000 mg | ORAL_CAPSULE | ORAL | Status: DC

## 2012-02-21 NOTE — Telephone Encounter (Signed)
Rx ready for pick up. 

## 2012-03-27 ENCOUNTER — Other Ambulatory Visit: Payer: Self-pay | Admitting: Family Medicine

## 2012-03-27 ENCOUNTER — Ambulatory Visit: Payer: BC Managed Care – PPO | Admitting: Family Medicine

## 2012-03-27 MED ORDER — AMPHETAMINE-DEXTROAMPHETAMINE 20 MG PO TABS: 20.0000 mg | ORAL_TABLET | Freq: Two times a day (BID) | ORAL | Status: DC

## 2012-03-27 NOTE — Telephone Encounter (Signed)
Pt returned old Rx's and new Rx's printed and signed by Dr. Artist Pais. Pt aware Rx's ready for pick up. Put at front desk.

## 2012-03-27 NOTE — Telephone Encounter (Signed)
Pt will bring old rx back. Pt would like generic adderall 20 mg-IR twice a day. Pt is aware MD out of office. Pt had appt today

## 2012-05-13 ENCOUNTER — Telehealth: Payer: Self-pay | Admitting: Family Medicine

## 2012-05-13 DIAGNOSIS — F988 Other specified behavioral and emotional disorders with onset usually occurring in childhood and adolescence: Secondary | ICD-10-CM

## 2012-05-13 NOTE — Telephone Encounter (Signed)
Pt needs new rxs methylphenidate 20 mg twice a day. #60. Pt is requesting 3 separate rx for next 3 months. Pt will like to pick up rxs on friday

## 2012-05-14 MED ORDER — METHYLPHENIDATE HCL 20 MG PO TABS: 20.0000 mg | ORAL_TABLET | Freq: Two times a day (BID) | ORAL | Status: DC

## 2012-05-14 NOTE — Telephone Encounter (Signed)
Rx ready for pick up and patient is aware 

## 2012-05-21 ENCOUNTER — Telehealth: Payer: Self-pay | Admitting: Family Medicine

## 2012-05-21 NOTE — Telephone Encounter (Signed)
Ryan w/ [facility] in [place] wanted to inform us that pt is getting Adderall and Ritalin from two different docs. Pharm would like you to call him and confirm this is OK. Thanks

## 2012-05-21 NOTE — Telephone Encounter (Signed)
Spoke with pharmacy

## 2012-06-17 ENCOUNTER — Telehealth: Payer: Self-pay | Admitting: Family Medicine

## 2012-06-17 DIAGNOSIS — F988 Other specified behavioral and emotional disorders with onset usually occurring in childhood and adolescence: Secondary | ICD-10-CM

## 2012-06-17 NOTE — Telephone Encounter (Signed)
Too early for Ritalin.... Did he want his Adderall?

## 2012-06-17 NOTE — Telephone Encounter (Signed)
PT is calling to request a refill of his methylphenidate (RITALIN) 20 MG tablet. Please assist.

## 2012-06-25 NOTE — Telephone Encounter (Signed)
Pt needs refill amphetamine-dextroamphetamine (ADDERALL) 20 MG tablet ° °

## 2012-06-26 MED ORDER — AMPHETAMINE-DEXTROAMPHET ER 20 MG PO CP24: 20.0000 mg | ORAL_CAPSULE | ORAL | Status: DC

## 2012-06-26 NOTE — Telephone Encounter (Signed)
rx ready for pickup 

## 2012-06-26 NOTE — Telephone Encounter (Signed)
Patient is not taking Ritalin.

## 2012-06-26 NOTE — Telephone Encounter (Signed)
Left message on machine for patient to clarify which med's he is taking.  Plain Adderall or Ritalin.  He should not be taking both.  Last office visit Ritalin was d/c and plain adderall twice daily was added to adderall XR.

## 2012-06-27 ENCOUNTER — Telehealth: Payer: Self-pay | Admitting: Family Medicine

## 2012-06-27 MED ORDER — AMPHETAMINE-DEXTROAMPHETAMINE 20 MG PO TABS: 20.0000 mg | ORAL_TABLET | Freq: Two times a day (BID) | ORAL | Status: DC

## 2012-06-27 NOTE — Telephone Encounter (Signed)
1 prescription for adderall 20 bid given. Patient to return for office visit.

## 2012-06-27 NOTE — Telephone Encounter (Signed)
PT is requesting 1 month of amphetamine-dextroamphetamine (ADDERALL) 20 MG tablet, and will be seeing Dr. Tawanna Cooler in 3 weeks to get all of his medication straightened out. Patient has returned his 3 month RX of Adderall XR, and it has been shredded. Please assist.

## 2012-07-17 ENCOUNTER — Ambulatory Visit: Payer: BC Managed Care – PPO | Admitting: Family Medicine

## 2012-07-28 ENCOUNTER — Encounter: Payer: Self-pay | Admitting: Family Medicine

## 2012-07-28 ENCOUNTER — Ambulatory Visit (INDEPENDENT_AMBULATORY_CARE_PROVIDER_SITE_OTHER): Payer: Self-pay | Admitting: Family Medicine

## 2012-07-28 VITALS — BP 110/80 | Temp 98.5°F | Wt 226.0 lb

## 2012-07-28 DIAGNOSIS — F988 Other specified behavioral and emotional disorders with onset usually occurring in childhood and adolescence: Secondary | ICD-10-CM

## 2012-07-28 MED ORDER — AMPHETAMINE-DEXTROAMPHETAMINE 20 MG PO TABS: 20.0000 mg | ORAL_TABLET | Freq: Three times a day (TID) | ORAL | Status: DC

## 2012-07-28 NOTE — Progress Notes (Signed)
  Subjective:    Patient ID: Todd Cobb, male    DOB: March 17, 1975, 37 y.o.   MRN: [id number]  HPI Todd Cobb is a 37 year old divorced male smoker who comes in today for evaluation of ADD  We have tried different combinations of medication. The long-acting preparations worked the best but he can't afford them he has no insurance. We found that these Arava metabolizer and the dose that he takes at noon is gone by 3:00 in the afternoon.  He's currently living by himself in [place] close to his family. The children spend Thursday through Sunday with him and the other days with mom. Todd Cobb the youngest is 38 years old male.   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

## 2012-07-28 NOTE — Patient Instructions (Signed)
Adderall 20 mg 3 times daily  Return in one year sooner if any problems

## 2012-10-14 ENCOUNTER — Telehealth: Payer: Self-pay | Admitting: Family Medicine

## 2012-10-14 NOTE — Telephone Encounter (Signed)
Pt needs new rx generic adderall 20 mg three times a day#90. Pt will pick up rx this Friday if possible. Pt is aware rx will be early

## 2012-10-16 MED ORDER — AMPHETAMINE-DEXTROAMPHETAMINE 20 MG PO TABS: 20.0000 mg | ORAL_TABLET | Freq: Three times a day (TID) | ORAL | Status: DC

## 2012-10-16 NOTE — Telephone Encounter (Signed)
Rx ready for pick up and patient is aware 

## 2012-12-17 ENCOUNTER — Telehealth: Payer: Self-pay | Admitting: Family Medicine

## 2012-12-17 NOTE — Telephone Encounter (Signed)
Pt needs new rx generic adderall  20 mg. Pt needs rxs for next 3 month °

## 2012-12-19 NOTE — Telephone Encounter (Signed)
Too early to refill at this time

## 2013-01-01 MED ORDER — AMPHETAMINE-DEXTROAMPHETAMINE 20 MG PO TABS: 20.0000 mg | ORAL_TABLET | Freq: Three times a day (TID) | ORAL | Status: DC

## 2013-01-01 NOTE — Telephone Encounter (Signed)
Left message on machine for patient that Rx ready for pick up.  Also explained the new control substance policy.

## 2013-01-23 ENCOUNTER — Encounter: Payer: Self-pay | Admitting: *Deleted

## 2013-04-15 ENCOUNTER — Telehealth: Payer: Self-pay | Admitting: Family Medicine

## 2013-04-15 NOTE — Telephone Encounter (Signed)
Pt is needing new rx amphetamine-dextroamphetamine (ADDERALL) 20 MG tablet, please call when available for pick up. ° °

## 2013-04-17 ENCOUNTER — Other Ambulatory Visit: Payer: Self-pay | Admitting: *Deleted

## 2013-04-17 ENCOUNTER — Ambulatory Visit: Payer: Self-pay | Admitting: Family Medicine

## 2013-04-17 ENCOUNTER — Ambulatory Visit (INDEPENDENT_AMBULATORY_CARE_PROVIDER_SITE_OTHER): Payer: 59 | Admitting: Internal Medicine

## 2013-04-17 ENCOUNTER — Encounter: Payer: Self-pay | Admitting: Internal Medicine

## 2013-04-17 VITALS — BP 128/90 | HR 77 | Temp 97.9°F | Ht 72.0 in | Wt 236.0 lb

## 2013-04-17 DIAGNOSIS — B9789 Other viral agents as the cause of diseases classified elsewhere: Principal | ICD-10-CM

## 2013-04-17 DIAGNOSIS — F17200 Nicotine dependence, unspecified, uncomplicated: Secondary | ICD-10-CM

## 2013-04-17 DIAGNOSIS — J069 Acute upper respiratory infection, unspecified: Secondary | ICD-10-CM

## 2013-04-17 DIAGNOSIS — K429 Umbilical hernia without obstruction or gangrene: Secondary | ICD-10-CM

## 2013-04-17 MED ORDER — AMPHETAMINE-DEXTROAMPHETAMINE 20 MG PO TABS: 20.0000 mg | ORAL_TABLET | Freq: Three times a day (TID) | ORAL | Status: DC

## 2013-04-17 MED ORDER — HYDROCODONE-HOMATROPINE 5-1.5 MG/5ML PO SYRP: 5.0000 mL | ORAL_SOLUTION | ORAL | Status: AC | PRN

## 2013-04-17 NOTE — Progress Notes (Signed)
Pre visit review using our clinic review tool, if applicable. No additional management support is needed unless otherwise documented below in the visit note.   Chief Complaint  Patient presents with  . Fever    Sometimes the cough is productive of a green/brown color.  Started a week ago.  . Cough  . Nasal Congestion    HPI: Patient comes in today for SDA for  new problem evaluation.  Hit hard a week a go about 10 days ago  Feverish at night sweating and  and didn't work this week .   shortness coughing outphelgm ? If had blood .ontime last week when coughin hard but not now no sinus pain .  Hard to sleep with cough  Family told him to get checked . Had sweats this am  medicatoins  otc ibu for this.  Works Nurse, mental health.  Tobacco less than ppd. ROS: See pertinent positives and negatives per HPI. nosyncope but light headed with cough fit . No V D   Past Medical History  Diagnosis Date  . ADD (attention deficit disorder)   . Osteomyelitis     right foot  . Subdural hematoma     resolved without surgery    Family History  Problem Relation Age of Onset  . Diabetes Other   . Hyperlipidemia Other   . Hypertension Other     History   Social History  . Marital Status: Married    Spouse Name: N/A    Number of Children: N/A  . Years of Education: N/A   Social History Main Topics  . Smoking status: Current Every Day Smoker  . Smokeless tobacco: None  . Alcohol Use:   . Drug Use:   . Sexual Activity:    Other Topics Concern  . None   Social History Narrative  . None    Outpatient Encounter Prescriptions as of 04/17/2013  Medication Sig  . cyclobenzaprine (FLEXERIL) 10 MG tablet Take 10 mg by mouth 3 (three) times daily as needed.    . [DISCONTINUED] amphetamine-dextroamphetamine (ADDERALL) 20 MG tablet Take 1 tablet (20 mg total) by mouth 3 (three) times daily. Fill in two months  . [DISCONTINUED] amphetamine-dextroamphetamine (ADDERALL) 20 MG tablet Take 1 tablet (20  mg total) by mouth 3 (three) times daily. Fill in one month  . [DISCONTINUED] amphetamine-dextroamphetamine (ADDERALL) 20 MG tablet Take 1 tablet (20 mg total) by mouth 3 (three) times daily.  . [DISCONTINUED] HYDROcodone-acetaminophen (VICODIN ES) 7.5-750 MG per tablet Take 1 tablet by mouth every 6 (six) hours as needed.    Marland Kitchen HYDROcodone-homatropine (HYCODAN) 5-1.5 MG/5ML syrup Take 5 mLs by mouth every 4 (four) hours as needed for cough.  . methylphenidate (RITALIN) 20 MG tablet Take 20 mg by mouth 2 (two) times daily.    EXAM:  BP 128/90  Pulse 77  Temp(Src) 97.9 F (36.6 C) (Oral)  Ht 6' (1.829 m)  Wt 236 lb (107.049 kg)  BMI 32.00 kg/m2  SpO2 98%  Body mass index is 32 kg/(m^2). WDWN in NAD  quiet respirations; mildly congested  somewhat hoarse. Non toxic .tired  HEENT: Normocephalic ;atraumatic , Eyes;  PERRL, EOMs  Full, lids and conjunctiva clear,,Ears: no deformities, canals nl, TM landmarks normal, Nose: no deformity or discharge but congested;face non tender Mouth : OP clear without lesion or edema . Mild redness  Neck: Supple without adenopathy or masses or bruits Chest:  Clear to A without wheezes rales or rhonchi CV:  S1-S2 no gallops or murmurs peripheral  perfusion is normal Skin :nl perfusion and no acute rashes  ABD : no masses about 3 cm umbil hernia non tendern\ hard to reduce  PSYCH: pleasant and cooperative, no obvious depression or anxiety  ASSESSMENT AND PLAN:  Discussed the following assessment and plan:  Viral URI with cough - recheck with alarm features or failure to improve   TOBACCO dependence  - disc cessationstrategy  Umbilical hernia - surgery referral  - Plan: Ambulatory referral to General Surgery  -Patient advised to return or notify health care team  if symptoms worsen or persist or new concerns arise.  Patient Instructions  Chest is clear  oday no pneumonia at this time.  cough med for comfort.  If not improving or getting fever chills  contact us .  May consider getting chest x fray .  Stopping smoking is the best you can do for your health.  Will be contacted about surgery consult for the umbilical hernia     Burna MortimerWanda K. Panosh M.D.

## 2013-04-17 NOTE — Patient Instructions (Signed)
Chest is clear  oday no pneumonia at this time.  cough med for comfort.  If not improving or getting fever chills contact us .  May consider getting chest x fray .  Stopping smoking is the best you can do for your health.  Will be contacted about surgery consult for the umbilical hernia

## 2013-04-20 ENCOUNTER — Telehealth: Payer: Self-pay | Admitting: Family Medicine

## 2013-04-20 NOTE — Telephone Encounter (Signed)
Relevant patient education mailed to patient.  

## 2013-07-09 ENCOUNTER — Telehealth: Payer: Self-pay | Admitting: Family Medicine

## 2013-07-09 NOTE — Telephone Encounter (Signed)
Pt needs new generic adderall 20 mg °

## 2013-07-14 MED ORDER — AMPHETAMINE-DEXTROAMPHETAMINE 20 MG PO TABS: 20.0000 mg | ORAL_TABLET | Freq: Three times a day (TID) | ORAL | Status: DC

## 2013-07-14 NOTE — Telephone Encounter (Signed)
Rx ready for pick up and patient is aware 

## 2013-10-12 ENCOUNTER — Telehealth: Payer: Self-pay | Admitting: Family Medicine

## 2013-10-12 NOTE — Telephone Encounter (Signed)
Pt needs new rx generic adderall 20 mg °

## 2013-10-13 MED ORDER — AMPHETAMINE-DEXTROAMPHETAMINE 20 MG PO TABS: 20.0000 mg | ORAL_TABLET | Freq: Three times a day (TID) | ORAL | Status: DC

## 2013-10-13 NOTE — Telephone Encounter (Signed)
Rx ready for pick up and patient is aware 

## 2014-01-19 ENCOUNTER — Telehealth: Payer: Self-pay | Admitting: Family Medicine

## 2014-01-19 MED ORDER — AMPHETAMINE-DEXTROAMPHETAMINE 20 MG PO TABS: 20.0000 mg | ORAL_TABLET | Freq: Three times a day (TID) | ORAL | Status: DC

## 2014-01-19 MED ORDER — AMPHETAMINE-DEXTROAMPHETAMINE 20 MG PO TABS
20.0000 mg | ORAL_TABLET | Freq: Three times a day (TID) | ORAL | Status: DC
Start: 2014-01-19 — End: 2014-04-20

## 2014-01-19 NOTE — Telephone Encounter (Signed)
Pt needs new rx generic adderall 20 mg °

## 2014-01-19 NOTE — Telephone Encounter (Signed)
Rx ready for pick up and patient is aware 

## 2014-04-19 ENCOUNTER — Telehealth: Payer: Self-pay | Admitting: *Deleted

## 2014-04-19 NOTE — Telephone Encounter (Signed)
Pt called requesting refills on medications. Please advise

## 2014-04-20 MED ORDER — AMPHETAMINE-DEXTROAMPHETAMINE 20 MG PO TABS: 20.0000 mg | ORAL_TABLET | Freq: Three times a day (TID) | ORAL | Status: DC

## 2014-04-20 NOTE — Addendum Note (Signed)
Addended by: Kern ReapVEREEN, Jamill Wetmore B on: 04/20/2014 09:10 AM   Modules accepted: Orders

## 2014-04-20 NOTE — Telephone Encounter (Signed)
Rx ready for pick up.  Tried to call patient at cell number but "code is incorrect"  Patient will need an office visit for more refills.

## 2014-06-22 ENCOUNTER — Ambulatory Visit (INDEPENDENT_AMBULATORY_CARE_PROVIDER_SITE_OTHER): Payer: 59 | Admitting: Family Medicine

## 2014-06-22 ENCOUNTER — Encounter: Payer: Self-pay | Admitting: Family Medicine

## 2014-06-22 VITALS — BP 122/84 | Temp 97.9°F | Wt 225.0 lb

## 2014-06-22 DIAGNOSIS — K429 Umbilical hernia without obstruction or gangrene: Secondary | ICD-10-CM | POA: Diagnosis not present

## 2014-06-22 DIAGNOSIS — Z720 Tobacco use: Secondary | ICD-10-CM

## 2014-06-22 DIAGNOSIS — F17200 Nicotine dependence, unspecified, uncomplicated: Secondary | ICD-10-CM

## 2014-06-22 NOTE — Progress Notes (Signed)
   Subjective:    Patient ID: Todd Cobb, male    DOB: May 18, 1975, 39 y.o.   MRN: [id number]  HPI Leonette MostCharles is a 39 year old male divorced smoker who comes in today for evaluation of an umbilical hernia  He's noticed over the last couple weeks enlargement of his umbilicus. No history of trauma  He smokes a half pack of cigarettes a day,,,,,,,, afraid to take the Chantix,,,,,,,,, advised to start the nicotine gum to get off the cigarettes   Review of Systems Review of systems negative    Objective:   Physical Exam well-developed well-nourished male no acute distress vital signs stable he is afebrile in the standing position there is a 5 cm umbilical hernia this reducible.       Assessment & Plan:  Umbilical hernia........ advised to wear an elastic support temporarily...Marland Kitchen.Marland Kitchen.Marland Kitchen. ultimately is currently need to have this fixed surgically........ consult with Dr. Harden MoMatt Wakefield  Tobacco abuse........ nicotine gum..... Get off the cigarettes

## 2014-06-22 NOTE — Progress Notes (Signed)
Pre visit review using our clinic review tool, if applicable. No additional management support is needed unless otherwise documented below in the visit note. 

## 2014-06-22 NOTE — Patient Instructions (Signed)
Call general surgery....... asked to see Dr. Harden MoMatt Cobb general surgeon for consultation  Wear the abdominal support as we discussed during the day....... you may leave it off at bedtime  Start the nicotine gum.......... stop smoking completely

## 2014-07-14 ENCOUNTER — Telehealth: Payer: Self-pay | Admitting: Family Medicine

## 2014-07-14 NOTE — Telephone Encounter (Signed)
Pt needs new rx generic adderall 20 mg °

## 2014-07-15 NOTE — Telephone Encounter (Signed)
Okay to fill 07/21/14

## 2014-07-20 MED ORDER — AMPHETAMINE-DEXTROAMPHETAMINE 20 MG PO TABS: 20.0000 mg | ORAL_TABLET | Freq: Three times a day (TID) | ORAL | Status: DC

## 2014-07-20 MED ORDER — AMPHETAMINE-DEXTROAMPHETAMINE 20 MG PO TABS
20.0000 mg | ORAL_TABLET | Freq: Three times a day (TID) | ORAL | Status: DC
Start: 2014-07-20 — End: 2014-10-20

## 2014-07-20 NOTE — Telephone Encounter (Signed)
Rx ready for pick and patient is aware 

## 2014-10-14 ENCOUNTER — Telehealth: Payer: Self-pay | Admitting: Family Medicine

## 2014-10-14 NOTE — Telephone Encounter (Signed)
Pt request refill  3 mo supply amphetamine-dextroamphetamine (ADDERALL) 20 MG tablet

## 2014-10-20 MED ORDER — AMPHETAMINE-DEXTROAMPHETAMINE 20 MG PO TABS: 20.0000 mg | ORAL_TABLET | Freq: Three times a day (TID) | ORAL | Status: DC

## 2014-10-20 NOTE — Telephone Encounter (Signed)
Left message on machine for patient that Rx is ready for pick up 

## 2015-02-01 ENCOUNTER — Telehealth: Payer: Self-pay | Admitting: Family Medicine

## 2015-02-01 MED ORDER — AMPHETAMINE-DEXTROAMPHETAMINE 20 MG PO TABS: 20.0000 mg | ORAL_TABLET | Freq: Three times a day (TID) | ORAL | Status: DC

## 2015-02-01 NOTE — Telephone Encounter (Signed)
Rx ready for pick up and patient is aware 

## 2015-02-01 NOTE — Telephone Encounter (Signed)
Patient would like his ADDERRAL 20mg  prescription refilled.

## 2015-05-03 ENCOUNTER — Telehealth: Payer: Self-pay | Admitting: Family Medicine

## 2015-05-03 NOTE — Telephone Encounter (Signed)
Pt request refill amphetamine-dextroamphetamine (ADDERALL) 20 MG tablet °3 mo supply °

## 2015-05-04 MED ORDER — AMPHETAMINE-DEXTROAMPHETAMINE 20 MG PO TABS: 20.0000 mg | ORAL_TABLET | Freq: Three times a day (TID) | ORAL | Status: AC

## 2015-05-04 NOTE — Telephone Encounter (Signed)
rx ready for pick up and patient is aware  

## 2015-06-24 ENCOUNTER — Telehealth: Payer: Self-pay | Admitting: Family Medicine

## 2015-06-24 NOTE — Telephone Encounter (Signed)
PA was done on cover my meds. It has been approved Patient notified

## 2015-07-26 ENCOUNTER — Ambulatory Visit (INDEPENDENT_AMBULATORY_CARE_PROVIDER_SITE_OTHER): Payer: 59 | Admitting: Psychology

## 2015-07-26 ENCOUNTER — Encounter (HOSPITAL_COMMUNITY): Payer: Self-pay | Admitting: Psychology

## 2015-07-26 DIAGNOSIS — F1120 Opioid dependence, uncomplicated: Secondary | ICD-10-CM

## 2015-07-26 DIAGNOSIS — F1020 Alcohol dependence, uncomplicated: Secondary | ICD-10-CM | POA: Diagnosis not present

## 2015-07-26 DIAGNOSIS — F1420 Cocaine dependence, uncomplicated: Secondary | ICD-10-CM
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
## 2015-07-27 ENCOUNTER — Other Ambulatory Visit (HOSPITAL_COMMUNITY): Payer: 59 | Attending: Medical | Admitting: Psychology

## 2015-07-27 ENCOUNTER — Other Ambulatory Visit (HOSPITAL_COMMUNITY): Payer: Self-pay | Admitting: Medical

## 2015-07-27 VITALS — BP 130/98 | HR 65 | Ht 72.0 in | Wt 222.6 lb

## 2015-07-27 DIAGNOSIS — F1120 Opioid dependence, uncomplicated: Secondary | ICD-10-CM | POA: Insufficient documentation

## 2015-07-27 DIAGNOSIS — F17200 Nicotine dependence, unspecified, uncomplicated: Secondary | ICD-10-CM | POA: Insufficient documentation

## 2015-07-27 DIAGNOSIS — F4310 Post-traumatic stress disorder, unspecified: Secondary | ICD-10-CM | POA: Insufficient documentation

## 2015-07-27 DIAGNOSIS — F19980 Other psychoactive substance use, unspecified with psychoactive substance-induced anxiety disorder: Secondary | ICD-10-CM | POA: Insufficient documentation

## 2015-07-27 DIAGNOSIS — F1910 Other psychoactive substance abuse, uncomplicated: Secondary | ICD-10-CM | POA: Insufficient documentation

## 2015-07-27 DIAGNOSIS — Z6372 Alcoholism and drug addiction in family: Secondary | ICD-10-CM | POA: Insufficient documentation

## 2015-07-27 DIAGNOSIS — F1994 Other psychoactive substance use, unspecified with psychoactive substance-induced mood disorder: Secondary | ICD-10-CM

## 2015-07-27 DIAGNOSIS — F1520 Other stimulant dependence, uncomplicated: Secondary | ICD-10-CM | POA: Insufficient documentation

## 2015-07-27 DIAGNOSIS — F1920 Other psychoactive substance dependence, uncomplicated: Secondary | ICD-10-CM
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
## 2015-07-28 ENCOUNTER — Encounter (HOSPITAL_COMMUNITY): Payer: Self-pay | Admitting: Medical

## 2015-07-28 ENCOUNTER — Other Ambulatory Visit (HOSPITAL_COMMUNITY): Payer: 59 | Admitting: Psychology

## 2015-07-28 DIAGNOSIS — F4310 Post-traumatic stress disorder, unspecified: Secondary | ICD-10-CM

## 2015-07-28 DIAGNOSIS — F1120 Opioid dependence, uncomplicated: Secondary | ICD-10-CM | POA: Diagnosis not present

## 2015-07-28 DIAGNOSIS — F1920 Other psychoactive substance dependence, uncomplicated: Secondary | ICD-10-CM

## 2015-07-29 LAB — PRESCRIPTION ABUSE MONITORING 17P, URINE
6-Acetylmorphine, Urine: NEGATIVE ng/mL
Amphetamine Scrn, Ur: NEGATIVE ng/mL
BARBITURATE SCREEN URINE: NEGATIVE ng/mL
BENZODIAZEPINE SCREEN, URINE: NEGATIVE ng/mL
BUPRENORPHINE, URINE: NEGATIVE ng/mL
CANNABINOIDS UR QL SCN: NEGATIVE ng/mL
CARISOPRODOL/MEPROBAMATE, UR: NEGATIVE ng/mL
COCAINE(METAB.)SCREEN, URINE: NEGATIVE ng/mL
CREATININE(CRT), U: 41.4 mg/dL (ref 20.0–300.0)
EDDP, Urine: NEGATIVE ng/mL
Fentanyl, Urine: NEGATIVE pg/mL
MDMA Screen, Urine: NEGATIVE ng/mL
METHADONE SCREEN, URINE: NEGATIVE ng/mL
Meperidine Screen, Urine: NEGATIVE ng/mL
Nitrite Urine, Quantitative: NEGATIVE ug/mL
OPIATE SCREEN URINE: NEGATIVE ng/mL
OXYCODONE+OXYMORPHONE UR QL SCN: NEGATIVE ng/mL
PHENCYCLIDINE QUANTITATIVE URINE: NEGATIVE ng/mL
Ph of Urine: 6.3 (ref 4.5–8.9)
Propoxyphene Scrn, Ur: NEGATIVE ng/mL
SPECIFIC GRAVITY: 1.009
TAPENTADOL, URINE: NEGATIVE ng/mL
Tramadol Screen, Urine: NEGATIVE ng/mL

## 2015-07-29 LAB — ETHYL GLUCURONIDE, URINE: ETHYL GLUCURONIDE SCREEN, UR: NEGATIVE ng/mL

## 2015-08-01 ENCOUNTER — Other Ambulatory Visit (HOSPITAL_COMMUNITY): Payer: Self-pay | Admitting: Medical

## 2015-08-01 ENCOUNTER — Other Ambulatory Visit (HOSPITAL_COMMUNITY): Payer: 59 | Admitting: Psychology

## 2015-08-01 ENCOUNTER — Encounter (HOSPITAL_COMMUNITY): Payer: Self-pay | Admitting: Psychology

## 2015-08-01 DIAGNOSIS — F1120 Opioid dependence, uncomplicated: Secondary | ICD-10-CM | POA: Diagnosis not present

## 2015-08-01 DIAGNOSIS — F1920 Other psychoactive substance dependence, uncomplicated: Secondary | ICD-10-CM

## 2015-08-02 ENCOUNTER — Encounter (HOSPITAL_COMMUNITY): Payer: Self-pay | Admitting: Psychology

## 2015-08-02 NOTE — Progress Notes (Signed)
    Daily Group Progress Note  Program: CD-IOP   Group Time: 1-2:30  Participation Level: Active  Behavioral Response: Appropriate, Sharing, Attention-Seeking and Drowsy  Type of Therapy: Process Group  Topic: Counselors met with patients for group process session. Patients discussed their recovery from mind-altering drugs and alcohol. Drug tests were collected from multiple patients. One [employer] student was present and observed group. Two new members were present for group today and shared briefly about the events that led them to seek tx. One group member called early to notify leader that she would be absent due to food-borne illness. Patient's absence was noted as "excused".     Group Time: 2:45-4  Participation Level: Active  Behavioral Response: Appropriate, Sharing and Attention-Seeking  Type of Therapy: Psycho-education Group  Topic: Counselors met with patients for group psychoeducation session. Topics included "how to dig deep emotionally in group". Counselors emphasized that patients experience of tx will depend on their engagement. Patients discussed AA meetings and rules for step work, sponsorship, and [employer].    Summary: Sobriety date remains Patient presented to group counseling today late from 1:30-4pm. Patient was initially drowsy but became active and engaged in session. Patient struggled to maintain focus and interrupted other group members to offer his thoughts. Counselor encouraged patient to keep focused on the goal of sobriety while in session and resist temptation to story-tell. Patient reported that he attended 3 NA meetings. Patient admitted that he asked his NA group if he could ever drink socially again. Patients discussed the chronic and progressive nature of the disease of addiction. Patient admitted that he would like to drink again socially but knew that he never could. Patient reported that he got a sponsor which the group  celebrated. Patient explained that he was actively looking for houses to [place] for extra income. [name], Counselor.   Family Program: Family present? NA   Name of family member(s):   UDS collected: Yes Results:   AA/NA attended?: YesFriday, Saturday and Sunday  Sponsor?: Yes   Elchonon Maxson, LCAS

## 2015-08-02 NOTE — Addendum Note (Signed)
Addended byLogan Bores: Eytan Carrigan on: 08/02/2015 01:19 PM   Modules accepted: Medications

## 2015-08-02 NOTE — Progress Notes (Signed)
Todd Cobb is a 40 y.o. male patient. CD-IOP: Treatment Planning Session. Counselor met with the patient this morning as scheduled. This represented out first individual session since he began the program. The importance of identifying goals for treatment was introduced and the patient responded appropriately. He identified his primary goal as leaning to remain alcohol and drug-free. The patient admitted he had tried to stop or control his use for many years and "I just can't do it myself". He identified building support for his sobriety through [place] and other recovery-related activities as his second goal of treatment. He lives in [place] and is attending the [employer] available in the county. When asked about any other goals of treatment, the patient admitted he is going to meet with his new probation officer tomorrow and will let me know if there are any other goals. We discussed his childhood briefly and his current marriage and relationship with his wife. She is supportive of his recovery. They have been married for about 2 years and when I asked about his addiction and the marriage, the patient reported, "she didn't know about it".  His father is an alcoholic and has been sober for 7 years. His brother w3as killed in a motorcycle accident almost 20 years ago. He is the only remaining child. We talked about addiction and the biological nature of his disease. When I explained more about the genetic predisposition and how we believe he crossed this slippery slope, the patient reported he felt much better hearing this and getting more understanding about how he became addicted. The patient was also instructed to share in the group session when others had stopped talking. It was pointed out that he has a tendency to cross-talk or mumble comments or observation while others are speaking. It is disruptive and very frustrating.  The patient apologized and stated that he would be careful to share his  observations and experiences when there is an opening and not just throw out anything he is thinking at the time. The treatment plan was reviewed, signed and completed accordingly. We will continue to monitor this patient closely in the days and weeks ahead. He is very bright and appears to be trying to 'out-think' this disease, which cannot be done.  Many times really bright people think there must be more to recovery than the steps and meetings, but there really isn't. Having said that.what seems simple is not necessarily easy. The patient responded well to this intervention and his sobriety date remains 5/23.       Talah Cookston, LCAS
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
## 2015-08-03 ENCOUNTER — Other Ambulatory Visit (HOSPITAL_COMMUNITY): Payer: 59 | Admitting: Psychology

## 2015-08-03 ENCOUNTER — Encounter (HOSPITAL_COMMUNITY): Payer: Self-pay | Admitting: Psychology

## 2015-08-03 DIAGNOSIS — F1420 Cocaine dependence, uncomplicated: Secondary | ICD-10-CM

## 2015-08-03 DIAGNOSIS — F1020 Alcohol dependence, uncomplicated: Secondary | ICD-10-CM

## 2015-08-03 DIAGNOSIS — F1520 Other stimulant dependence, uncomplicated: Secondary | ICD-10-CM

## 2015-08-03 DIAGNOSIS — F1120 Opioid dependence, uncomplicated: Secondary | ICD-10-CM | POA: Diagnosis not present

## 2015-08-03 LAB — PRESCRIPTION ABUSE MONITORING 17P, URINE
6-Acetylmorphine, Urine: NEGATIVE ng/mL
Amphetamine Scrn, Ur: NEGATIVE ng/mL
BARBITURATE SCREEN URINE: NEGATIVE ng/mL
BENZODIAZEPINE SCREEN, URINE: NEGATIVE ng/mL
BUPRENORPHINE, URINE: NEGATIVE ng/mL
CANNABINOIDS UR QL SCN: NEGATIVE ng/mL
CARISOPRODOL/MEPROBAMATE, UR: NEGATIVE ng/mL
COCAINE(METAB.)SCREEN, URINE: NEGATIVE ng/mL
Creatinine(Crt), U: 178.4 mg/dL (ref 20.0–300.0)
EDDP, Urine: NEGATIVE ng/mL
FENTANYL, URINE: NEGATIVE pg/mL
MDMA Screen, Urine: NEGATIVE ng/mL
MEPERIDINE SCREEN, URINE: NEGATIVE ng/mL
Methadone Screen, Urine: NEGATIVE ng/mL
NITRITE URINE, QUANTITATIVE: NEGATIVE ug/mL
OXYCODONE+OXYMORPHONE UR QL SCN: NEGATIVE ng/mL
Opiate Scrn, Ur: NEGATIVE ng/mL
PHENCYCLIDINE QUANTITATIVE URINE: NEGATIVE ng/mL
Ph of Urine: 5.9 (ref 4.5–8.9)
Propoxyphene Scrn, Ur: NEGATIVE ng/mL
SPECIFIC GRAVITY: 1.027
TRAMADOL SCREEN, URINE: NEGATIVE ng/mL
Tapentadol, Urine: NEGATIVE ng/mL

## 2015-08-03 LAB — ETHYL GLUCURONIDE, URINE: Ethyl Glucuronide Screen, Ur: NEGATIVE ng/mL

## 2015-08-03 NOTE — Progress Notes (Signed)
    Daily Group Progress Note  Program: CD-IOP   Group Time: 1-2 pm  Participation Level: Minimal  Behavioral Response: Sharing  Type of Therapy: Process Group  Topic: Process: the first 30 minutes of group today was spent in process. Members were asked to briefly recount what they had done to support their sobriety since yesterday afternoon's group session. The new group member arrived about 30 minutes late, but apologized when he arrived. He had phoned and left message about late arrival time.   Group Time: 2:15-4pm  Participation Level: Active  Behavioral Response: Sharing  Type of Therapy: Psycho-education Group  Topic: Psycho-Ed: Family Sculpture/Graduation. The remainder of the session was spent in a psycho-ed on 'Family Sculpture'. This was a continuation of yesterday's exercise. A member volunteered and used his fellow group members to represent his own family members. Two other members provided their own family sculptures and both were powerful and generated good discussion. As the session neared an end, a graduation ceremony was held honoring a member who was completing the program successfully today. Kind words were shared and the patient expressed gratitude and appreciation to the group for the support he had received while here in the program.   Summary: The patient arrived late for group today, but he had phoned and left a message. He had taken his wife for her colonoscopy and he had not gotten her back home as soon as he had anticipated. He reported he had attended 1 AA meeting last night. It was the "[address] speakers meeting". After the meeting he had spent a while talking with another recovering alcoholic and it had been a good talk. The patient reported he has 2 children from a previous marriage and his 89 yo daughter had phoned him and reported she had gotten all 4's on her tests. She was very pleased and told him that she loved him. The patient noted he has not seen  her since last July nor has he seen his son. The patient was attentive during the [facility] and offered good feedback to the ArvinMeritorsculptor. As the session neared an end, the patient noted he had not known the graduating member for very long, but wished him well. This patient is struggling in early sobriety, but meeting our program criteria at this time. His sobriety date is 5/23.   Family Program: Family present? No   Name of family member(s):   UDS collected: No Results:  AA/NA attended?: YesWednesday  Sponsor?: Yes   Zaria Taha, LCAS

## 2015-08-04 ENCOUNTER — Other Ambulatory Visit (HOSPITAL_COMMUNITY): Payer: 59 | Admitting: Psychology

## 2015-08-04 DIAGNOSIS — F1920 Other psychoactive substance dependence, uncomplicated: Secondary | ICD-10-CM

## 2015-08-04 DIAGNOSIS — F1120 Opioid dependence, uncomplicated: Secondary | ICD-10-CM | POA: Diagnosis not present

## 2015-08-08 ENCOUNTER — Encounter (HOSPITAL_COMMUNITY): Payer: Self-pay | Admitting: Psychology

## 2015-08-08 ENCOUNTER — Other Ambulatory Visit (HOSPITAL_COMMUNITY): Payer: 59 | Admitting: Psychology

## 2015-08-08 ENCOUNTER — Other Ambulatory Visit (HOSPITAL_COMMUNITY): Payer: Self-pay | Admitting: Medical

## 2015-08-08 DIAGNOSIS — F1520 Other stimulant dependence, uncomplicated: Secondary | ICD-10-CM

## 2015-08-08 DIAGNOSIS — F1920 Other psychoactive substance dependence, uncomplicated: Secondary | ICD-10-CM

## 2015-08-08 DIAGNOSIS — F1120 Opioid dependence, uncomplicated: Secondary | ICD-10-CM | POA: Diagnosis not present

## 2015-08-08 NOTE — Progress Notes (Signed)
    Daily Group Progress Note  Program: CD-IOP   Group Time: 1-2:30  Participation Level: Active  Behavioral Response: Appropriate and Sharing  Type of Therapy: Process Group  Topic: Counselors met with patients for group process session. Patients discussed their recovery from mind-altering drugs and alcohol. All patients were active and engaged in session. One patient graduated from the program.   Group Time: 2:45-4  Participation Level: Active  Behavioral Response: Appropriate and Sharing  Type of Therapy: Psycho-education Group  Topic: Counselors met with patients for group psychoeducation session. Counselors taught group about addiction and the brain. Patients strategized about external and internal triggers that cause them to use. Counselors helped patients identify their individual triggers.   Summary: Sobriety date remains 5/23. Patient presented for group counseling today from 1-4pm. Patient was 10 mins late to first session. Counselor met briefly and privately with patient to discuss importance of arriving on time and planning ahead in recovery. Patient agreed but displayed some slight resistance saying he had only been late "a few times". Patient checked in that he had attended 2 AA meetings and that he was learning about his high level of "self-centeredness" when he was using. 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 Patient displayed some frustration towards counselor when he confronted her about misnaming his town of residence a few sessions ago. He reported that he had been holding onto bitterness but that he was trying to "let it go". Counselor validated his frustration and reflected that she appreciated his honestly. Todd Cobb, Counselor   Family Program: Family present? NA   Name of family member(s):   UDS collected: No Results: negative  AA/NA  attended?: Yes Wednesday and Thursday  Sponsor?: Yes   Todd Cobb, LCAS

## 2015-08-08 NOTE — Progress Notes (Signed)
    Daily Group Progress Note  Program: CD-IOP   Group Time: 1-2:30 pm  Participation Level: Active  Behavioral Response: Sharing  Type of Therapy: Process Group  Topic: Process; the first half of group was spent in process. After check-in, a brief meditation was held, with members mindfully focusing on their breath while listening to a meditation from [email or web address]-page.info/. Members shared about challenges and struggles in early recovery and what they had done to strengthen their recovery. Random drug tests were collected and the medical director met with 2 group members during the session.   Group Time: 2:45-4 pm  Participation Level: Active  Behavioral Response: Appropriate and Sharing  Type of Therapy: Psycho-education Group  Topic: Psycho-Ed. The second half of group was spent in a psycho-ed. The topic was the "Neurobiology of Addiction". The session focused on educating group members on the biological nature of their addictions and the source of the biology to be found in their brain chemistry. A brief video was shown explaining this chemical imbalance and members expressed relief and a sense to acceptance now that they have a better understanding of how they got here. The discussion was lively with valuable feedback and disclosure among group members.  Summary: The patient reported he had attended 2 AA meetings since we last met. He had also met with his probation officer who had completed a home visit on Tuesday. He had reported previously that he had spent a lot of Monday evening cleaning up the house so it was presentable to his PO. He had his father there present during their meeting. The patient was attentive during the psycho-ed. He admitted, "I had not been accountable in my active addiction". The patient agreed with another member that recognizing that the addiction was something within his brain chemistry gave him some relief. The patient also noted that as he has attained a little more  sobriety, "I am waking up or coming to". He has been spoken to about his tendency to interrupt and made random comments that frequently prove disruptive. The patient responded well to this intervention and his sobriety date remains 5/22.   Family Program: Family present? No   Name of family member(s):   UDS collected: No Results: pending  AA/NA attended?: YesMonday and Tuesday  Sponsor?: Yes   Tashiba Timoney, LCAS

## 2015-08-10 ENCOUNTER — Other Ambulatory Visit (HOSPITAL_COMMUNITY): Payer: 59 | Admitting: Psychology

## 2015-08-10 ENCOUNTER — Encounter (HOSPITAL_COMMUNITY): Payer: Self-pay | Admitting: Psychology

## 2015-08-10 DIAGNOSIS — F1120 Opioid dependence, uncomplicated: Secondary | ICD-10-CM | POA: Diagnosis not present

## 2015-08-10 DIAGNOSIS — F1920 Other psychoactive substance dependence, uncomplicated: Secondary | ICD-10-CM

## 2015-08-10 DIAGNOSIS — F1020 Alcohol dependence, uncomplicated: Secondary | ICD-10-CM

## 2015-08-10 LAB — PRESCRIPTION ABUSE MONITORING 17P, URINE
6-ACETYLMORPHINE, URINE: NEGATIVE ng/mL
AMPHETAMINE SCREEN URINE: NEGATIVE ng/mL
BARBITURATE SCREEN URINE: NEGATIVE ng/mL
BENZODIAZEPINE SCREEN, URINE: NEGATIVE ng/mL
Buprenorphine, Urine: NEGATIVE ng/mL
CANNABINOIDS UR QL SCN: NEGATIVE ng/mL
CREATININE(CRT), U: 67.6 mg/dL (ref 20.0–300.0)
Carisoprodol/Meprobamate, Ur: NEGATIVE ng/mL
Cocaine (Metab) Scrn, Ur: NEGATIVE ng/mL
EDDP, Urine: NEGATIVE ng/mL
Fentanyl, Urine: NEGATIVE pg/mL
MDMA SCREEN, URINE: NEGATIVE ng/mL
METHADONE SCREEN, URINE: NEGATIVE ng/mL
Meperidine Screen, Urine: NEGATIVE ng/mL
Nitrite Urine, Quantitative: NEGATIVE ug/mL
OPIATE SCREEN URINE: NEGATIVE ng/mL
OXYCODONE+OXYMORPHONE UR QL SCN: NEGATIVE ng/mL
PH UR, DRUG SCRN: 6.2 (ref 4.5–8.9)
PROPOXYPHENE SCREEN URINE: NEGATIVE ng/mL
Phencyclidine Qn, Ur: NEGATIVE ng/mL
SPECIFIC GRAVITY: 1.017
TAPENTADOL, URINE: NEGATIVE ng/mL
Tramadol Screen, Urine: NEGATIVE ng/mL

## 2015-08-10 LAB — ETHYL GLUCURONIDE, URINE: ETHYL GLUCURONIDE SCREEN, UR: NEGATIVE ng/mL

## 2015-08-10 NOTE — Progress Notes (Signed)
    Daily Group Progress Note  Program: CD-IOP   Group Time: 1-2:30 pm  Participation Level: Active  Behavioral Response: Sharing  Type of Therapy: Process Group  Topic: Process:  The first part of group was spent in process. Members shared about what they had done over the weekend to support their recovery. They also identified any challenges or temptations that may have presented themselves since we last met. One member checked-in with a new sobriety date of today. He proceeded to share the events of the weekend, including a return to use on Friday and then again on Sunday. He was filled with remorse as he described these events.  Much of the remainder of this session was spent processing the relapses. The medical director met with 2 group members today and random drug tests were collected from 6 group members.   Group Time: 2:45-4 pm  Participation Level: Active  Behavioral Response: Sharing and appopriate  Type of Therapy: Psycho-Ed Group  Topic: Psycho-Ed: Relapse Prevention. The second half of group was spent in a psycho-ed. it included a utube video about Baclofen, which reduces cocaine cravings. After discussing the video, group members were given a handout entitled, "Be Smart, Not Strong" from the [employer] Treatment Manual. It followed on the importance of avoiding triggers and potentially dangerous relapse situations. The focus was not on using willpower, which doesn't work, but setting one's self up for Sobriety.  Summary: The patient reported a good weekend. He had spent some time with his father and sibling along with her family on Sunday. He had done a lot of yard work on Saturday and helped a friend with some tile work. The patient reported his wife had complained about him doing work at other people's houses, but not being home and completing the work that needs to be done around their house. She later apologized to him, but he noted that she had intensive dental work on  Friday and was in a lot of discomfort. They had gone to a 12-step meeting together on Thursday evening and while he attended the [place] meeting, she was next door ion the Al-Anon meeting. In total, the patient attended 5 AA meetings since this group last met. He had seen another member at a speaker meeting and both laughed and agreed that it had been a powerful story. The patient provided helpful feedback to his fellow group member and expressed admiration to him for having 'come back'. This patient wasn't sure if he could get back if he went back out again. The patient identified some triggers and how he might address them should they reappear in his life. He was engaged and appropriate today. He had been warned about breaking in and interrupting when he was alone with this counselor. The patient responded well to this intervention. His sobriety date is 5/22.   Family Program: Family present? No   Name of family member(s):   UDS collected: Yes Results: pending  AA/NA attended?: YesMonday, Thursday, Friday, Saturday and Sunday  Sponsor?: Yes   Todd Cobb, LCAS

## 2015-08-11 ENCOUNTER — Other Ambulatory Visit (HOSPITAL_COMMUNITY): Payer: 59 | Admitting: Psychology

## 2015-08-11 ENCOUNTER — Encounter (HOSPITAL_COMMUNITY): Payer: Self-pay | Admitting: Psychology

## 2015-08-11 DIAGNOSIS — F1120 Opioid dependence, uncomplicated: Secondary | ICD-10-CM | POA: Diagnosis not present

## 2015-08-11 DIAGNOSIS — F1020 Alcohol dependence, uncomplicated: Secondary | ICD-10-CM

## 2015-08-11 DIAGNOSIS — F1520 Other stimulant dependence, uncomplicated: Secondary | ICD-10-CM

## 2015-08-11 DIAGNOSIS — F1994 Other psychoactive substance use, unspecified with psychoactive substance-induced mood disorder: Secondary | ICD-10-CM

## 2015-08-11 NOTE — Progress Notes (Signed)
    Daily Group Progress Note  Program: CD-IOP   Group Time: 1-2:30  Participation Level: Active  Behavioral Response: Appropriate and Sharing  Type of Therapy: Process Group  Topic: Counselors met with patients for group process session. Patients discussed their recovery from mind-altering drugs and alcohol. Patients were active and engaged. One patient was excused absent for a prescheduled vacation. Drug test was collected from one patient who missed group on Monday. Patients briefly checked in with their sobriety dates and any challenges they had faced since last meeting in CD-IOP. 2 Patients met with Investment banker, operational.     Group Time: 2:45-4  Participation Level: Active  Behavioral Response: Appropriate and Sharing  Type of Therapy: Psycho-education Group  Topic: Counselors met with patients for group psychoeducation session. The topic included relapse prevention techniques and developing a relapse prevention plan. Counselor showed a video describing the 3 stages of relapse: emotional, mental, and physical. Patients were encouraged to think critically about their experiences with relapsing and how they could avoid their triggers before they are physically tempted to use.   Summary: Sobriety date remains 5/22 Patient presented for group counseling today from 1-4pm. Patient was active and engaged in group and showed a marked increase in his helpful engagement with the group, and decrease in attention-seeking behaviors/comments, and a willingness to listen to others w/o giving into impulse to comment. 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 Patient admitted that he did the work but felt resentful about it. Patient shared that he later went to a meeting and learned that he was a "score keeper" and that he was a perfectionist about being treated "fairly". He shared that he hopes to start  doing step work with his sponsor. He also shared that he started drinking from an early age and that he had a binge drinking episode when he was in 8th grade. [name], Counselor   Family Program: Family present? NA   Name of family member(s):   UDS collected: No Results: negative  AA/NA attended?: YesMonday and Tuesday  Sponsor?: Yes   Brinklee Cisse, LCAS

## 2015-08-15 ENCOUNTER — Other Ambulatory Visit (HOSPITAL_COMMUNITY): Payer: 59 | Admitting: Psychology

## 2015-08-15 ENCOUNTER — Other Ambulatory Visit (HOSPITAL_COMMUNITY): Payer: Self-pay | Admitting: Medical

## 2015-08-15 DIAGNOSIS — F1520 Other stimulant dependence, uncomplicated: Secondary | ICD-10-CM

## 2015-08-15 DIAGNOSIS — F1120 Opioid dependence, uncomplicated: Secondary | ICD-10-CM | POA: Diagnosis not present

## 2015-08-15 DIAGNOSIS — F1920 Other psychoactive substance dependence, uncomplicated: Secondary | ICD-10-CM

## 2015-08-17 ENCOUNTER — Encounter (HOSPITAL_COMMUNITY): Payer: Self-pay | Admitting: Psychology

## 2015-08-17 ENCOUNTER — Other Ambulatory Visit (HOSPITAL_COMMUNITY): Payer: 59 | Admitting: Psychology

## 2015-08-17 DIAGNOSIS — F1020 Alcohol dependence, uncomplicated: Secondary | ICD-10-CM

## 2015-08-17 DIAGNOSIS — F1520 Other stimulant dependence, uncomplicated: Secondary | ICD-10-CM

## 2015-08-17 DIAGNOSIS — F1920 Other psychoactive substance dependence, uncomplicated: Secondary | ICD-10-CM

## 2015-08-17 DIAGNOSIS — F1120 Opioid dependence, uncomplicated: Secondary | ICD-10-CM | POA: Diagnosis not present

## 2015-08-17 LAB — PRESCRIPTION ABUSE MONITORING 17P, URINE
6-Acetylmorphine, Urine: NEGATIVE ng/mL
Amphetamine Scrn, Ur: NEGATIVE ng/mL
BARBITURATE SCREEN URINE: NEGATIVE ng/mL
BENZODIAZEPINE SCREEN, URINE: NEGATIVE ng/mL
BUPRENORPHINE, URINE: NEGATIVE ng/mL
CANNABINOIDS UR QL SCN: NEGATIVE ng/mL
CARISOPRODOL/MEPROBAMATE, UR: NEGATIVE ng/mL
CREATININE(CRT), U: 58.5 mg/dL (ref 20.0–300.0)
Cocaine (Metab) Scrn, Ur: NEGATIVE ng/mL
Fentanyl, Urine: NEGATIVE pg/mL
MDMA SCREEN, URINE: NEGATIVE ng/mL
Meperidine Screen, Urine: NEGATIVE ng/mL
Methadone Screen, Urine: NEGATIVE ng/mL
Nitrite Urine, Quantitative: NEGATIVE ug/mL
OXYCODONE+OXYMORPHONE UR QL SCN: NEGATIVE ng/mL
Opiate Scrn, Ur: NEGATIVE ng/mL
Ph of Urine: 5.9 (ref 4.5–8.9)
Phencyclidine Qn, Ur: NEGATIVE ng/mL
Propoxyphene Scrn, Ur: NEGATIVE ng/mL
SPECIFIC GRAVITY: 1.013
TRAMADOL SCREEN, URINE: NEGATIVE ng/mL
Tapentadol, Urine: NEGATIVE ng/mL

## 2015-08-17 LAB — ETHYL GLUCURONIDE, URINE: Ethyl Glucuronide Screen, Ur: NEGATIVE ng/mL

## 2015-08-17 NOTE — Progress Notes (Signed)
    Daily Group Progress Note  Program: CD-IOP   Group Time: 1-2:30 pm  Participation Level: Active  Behavioral Response: Sharing  Type of Therapy: Process Group  Topic: Process: The first half of group was spent in process. Members shared about current issues and concerns that they are facing in early recovery. The disclosures were compelling and members were engaged and active in the session. One member reported he had gone into his car, where he used to use frequently, and found a "torch" (a lighter) that he had used to smoke drugs. It had stirred up some powerful emotion and he had sought out others in recovery to reassure him. Drug test results were returned to group members and everyone had reportedly remained abstinent since we last met.   Group Time: 2:45-4pm  Participation Level: active  Behavioral Response: Sharing Type of Therapy: Psycho-education Group  Topic: Psycho-Ed: Living Life on Life's Terms/Serenity Prayer; the second half of group was spent in process. Members were asked to explain what the "Living Life.." means to them. While some articulated its meaning quite succinctly, other members really didn't understand how this might relate to their recovery. This led into a discussion on the Serenity Prayer with members completing a handout asking them to identify what they can and cannot change. This session was intense and challenged members to examine their lives and perspective very seriously.    Summary: Sobriety Date = 5/23. The patient reported he had attended an [place] meeting last night at the [employer] here in [place]. He noted that he had found a 'torch' in his car this morning and it had really freaked him out. 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 He admitted that his car has not been cleaned out in the last year and it is really trashed due to his drug use. This counselor encouraged him to get someone to help him clean it out in case he does come  across something else. The patient reported here was going to pick up his 30-day chip tonight at the [employer] in [place]. During the presentation on the [place], the patient identified the things he cannot change include: his ex-wife, his addiction or the legal problems that have been caused by his addiction. He reported the things he can change as being: "my recovery, my health and the daily activities I take part in". the patient reported he is trying to address his wife's concerns and make everybody happy. He was encouraged to focus on what he must do to remain sober and most likely others will be pleased with him. The patient has attained 30 days of sobriety and is making measurable progress in his very early recovery. He responded well to this intervention.   Family Program: Family present? No  Name of family member(s):   UDS collected: No Results:   AA/NA attended?:Yes, Wednesday evening  Sponsor?: Yes   Tanya Marvin, LCAS

## 2015-08-18 ENCOUNTER — Other Ambulatory Visit (HOSPITAL_COMMUNITY): Payer: 59 | Admitting: Psychology

## 2015-08-18 DIAGNOSIS — F1120 Opioid dependence, uncomplicated: Secondary | ICD-10-CM | POA: Diagnosis not present

## 2015-08-18 DIAGNOSIS — F1020 Alcohol dependence, uncomplicated: Secondary | ICD-10-CM

## 2015-08-18 DIAGNOSIS — F1920 Other psychoactive substance dependence, uncomplicated: Secondary | ICD-10-CM

## 2015-08-18 DIAGNOSIS — F1520 Other stimulant dependence, uncomplicated: Secondary | ICD-10-CM

## 2015-08-19 ENCOUNTER — Encounter (HOSPITAL_COMMUNITY): Payer: Self-pay | Admitting: Psychology

## 2015-08-19 NOTE — Progress Notes (Signed)
    Daily Group Progress Note  Program: CD-IOP   Group Time: 1-2:30 pm  Participation Level: Active  Behavioral Response: Appropriate and Sharing  Type of Therapy: Process Group  Topic: Process: the first part of group was spent in process. Members shared about the past weekend and any challenges to their sobriety was discussed. A member returned after missing the last 3 group sessions on a long-ago scheduled vacation. She talked about her experiences. During group today, the Market researcher met with the member scheduled to graduate today. Five random drug tests were collected today.  Group Time: 2:45-4pm  Participation Level: Active  Behavioral Response: Sharing  Type of Therapy: Psycho-ED  Topic: Psycho-Ed; Communication/Graduation: the second half of group was spent in a psycho-ed on 'Communication'. Members were provided a handout and asked to split up into dyads and sit with their backs to each other. Then one of the two people began to describe an image for the other person and he/she were asked to draw the image based on the verbal description. At the end of this session, members provided each other feedback about how they might have explained things more clearly. A discussion on communication and some of the most essential elements of communication identified by members. With 20 minutes remaining, the session was halted and a graduation ceremony observed. The patient's husband had come to visit and be present for his wife's graduation. Kind and encouraging words of hope and encouragement were shared with the graduating member and a few tears were shed by her.   Summary: Sobriety date = 5/23. The patient reported a busy weekend that was very stressful. However, the weekend had begun well with the patient picking up a 30-day chip on Thursday evening. He had felt good, but admitted he has also felt overwhelmed and stressed out that things won't get better or easier. The patient  reported he had gotten angry with his wife and step-daughter and had broken his $700 phone. He really couldn't describe what had caused him to be so angry and d destructive. He recalled the conversation with his wife at dinner on Saturday evening. She had told him that, "If you use again, we're over". The patient admitted that this had been very hurtful for him. He had attended 3 AA meetings since we last met. The patient suggested that he felt as if he had 'surrendered' to his addiction this weekend and accepted that he cannot use again. In the psycho-ed, the patient emphasized the importance of being clear when it comes to communication and that eye contact is primary when it comes to communicating well. He wished the graduating member well and encouraged her to remain active in [place] and not to get complacent. The patient displayed good insight and he responded well to this intervention.    Family Program: Family present? No  Name of family member(s):   UDS collected: YesResults: negative  AA/NA attended?: Yes, Thursday, Sunday  Sponsor?: Yes   Anita Laguna, LCAS

## 2015-08-22 ENCOUNTER — Other Ambulatory Visit (HOSPITAL_COMMUNITY): Payer: 59 | Attending: Medical | Admitting: Psychology

## 2015-08-22 DIAGNOSIS — F1120 Opioid dependence, uncomplicated: Secondary | ICD-10-CM | POA: Diagnosis not present

## 2015-08-22 DIAGNOSIS — F1920 Other psychoactive substance dependence, uncomplicated: Secondary | ICD-10-CM

## 2015-08-22 DIAGNOSIS — F1020 Alcohol dependence, uncomplicated: Secondary | ICD-10-CM

## 2015-08-22 DIAGNOSIS — F1520 Other stimulant dependence, uncomplicated: Secondary | ICD-10-CM

## 2015-08-24 ENCOUNTER — Encounter (HOSPITAL_COMMUNITY): Payer: Self-pay | Admitting: Psychology

## 2015-08-24 ENCOUNTER — Other Ambulatory Visit (HOSPITAL_COMMUNITY): Payer: 59 | Admitting: Psychology

## 2015-08-24 ENCOUNTER — Other Ambulatory Visit (HOSPITAL_COMMUNITY): Payer: Self-pay | Admitting: Medical

## 2015-08-24 DIAGNOSIS — F1420 Cocaine dependence, uncomplicated: Secondary | ICD-10-CM

## 2015-08-24 DIAGNOSIS — F4310 Post-traumatic stress disorder, unspecified: Secondary | ICD-10-CM

## 2015-08-24 DIAGNOSIS — F1520 Other stimulant dependence, uncomplicated: Secondary | ICD-10-CM

## 2015-08-24 DIAGNOSIS — F1120 Opioid dependence, uncomplicated: Secondary | ICD-10-CM | POA: Diagnosis not present

## 2015-08-24 DIAGNOSIS — F1020 Alcohol dependence, uncomplicated: Secondary | ICD-10-CM

## 2015-08-24 NOTE — Progress Notes (Signed)
    Daily Group Progress Note  Program: CD-IOP   Group Time: 1-2:30  Participation Level: Active  Behavioral Response: Appropriate and Sharing  Type of Therapy: Process Group  Topic: Counselors met with patients for group process session. Patients discussed their recovery from mind-altering drugs and alcohol. Patients were active and engaged. Counselor used open questions and Motivational Interviewing to help patients develop a stronger sense of motivation. Patients responded well to interventions and encouraged each other in recovery.     Group Time: 2:45-4  Participation Level: Active  Behavioral Response: Appropriate and Sharing  Type of Therapy: Psycho-education Group  Topic: Counselors met with patients for group psychoeducation session. The topic included emotions and feelings. Counselor led group game designed to elicit group interaction and discussion of feelings. Patients reported that they enjoyed the game and felt that it led to discussion on anger, resentment, bitterness, joy, and peace. Patients were active and engaged in psychoeducation session.    Summary: Sobriety date remains 5/23. Patient presented for group therapy and was active and engaged in session today. He reported that he attended 2 AA meetings and was enjoying his time in the fellowship of AA. Patient shared that he has begun reading recovery-related books and speaks with his sponsor daily. Patient reported that he feels his temper is easing and he is better able to predict when he will become angry. Patient has begun working on the [employer] with his sponsor and is on "step 1". Counselor had previously encouraged pt to attend earlier meetings and patient reported that he was enjoying the earlier meetings since he feels "charged for the rest of the day". Patient is doing well in his recovery and expresses a happiness and focus in group that he has not felt while in his active addiction. Patient is responding  to encouragement and Motivational Interviewing from counselor. Patient is gaining awareness about his selfishness and isolation while using drugs/alcohol. [name], Counselor  Family Program: Family present? No   Name of family member(s):   UDS collected: No Results: negative  AA/NA attended?: YesWednesday and Thursday  Sponsor?: Yes   Kalia Vahey, LCAS

## 2015-08-24 NOTE — Progress Notes (Signed)
    Daily Group Progress Note  Program: CD-IOP   Group Time: 1-2:30  Participation Level: Active  Behavioral Response: Appropriate and Sharing  Type of Therapy: Process Group  Topic: Clinician facilitated check-in regarding current stressors and situation, and progress on recovery. Clinician utilized active listening, validation, and empathetic response.  Clinician introduced topic of communication, specifically the types of communication and refusal skills,  and led discussion and activity based on this topic. Clinician assessed for immediate needs and safety concerns.       Group Time: 2:45-4  Participation Level: Active  Behavioral Response: Appropriate and Sharing  Type of Therapy: Psycho-education Group  Topic: Clinician facilitated check-in regarding current stressors and situation, and progress on recovery. Clinician utilized active listening, validation, and empathetic response.  Clinician introduced topic of communication, specifically the types of communication and refusal skills,  and led discussion and activity based on this topic. Clinician assessed for immediate needs and safety concerns.     Summary: Patient presents as positive and engaged. Patient reports understanding of topic and related it to personal experience, recognizing ways to improve. Patient was encouraging of other group members and demonstrated insight. Patient denies SI/HI/self harm thoughts and immediate needs.     Family Program: Family present? No   Name of family member(s):   UDS collected: No Results: negative  AA/NA attended?: YesTuesday  Sponsor?: Yes   Quianna Avery, LCAS

## 2015-08-24 NOTE — Progress Notes (Signed)
    Daily Group Progress Note  Program: CD-IOP   Group Time: 1-2:30  Participation Level: Active  Behavioral Response: Appropriate and Sharing  Type of Therapy: Process Group  Topic: Counselors met with patients for group process session. Patients discussed their recovery from mind-altering drugs and alcohol. Patients were active and engaged. Counselor used open questions and Motivational Interviewing to help patients develop a stronger sense of motivation. Patients responded well to interventions and encouraged each other in recovery. Patients discussed Fourth of July plans.      Group Time: 2:45-4  Participation Level: Active  Behavioral Response: Appropriate and Sharing  Type of Therapy: Psycho-education Group  Topic: Counselors met with patients for group psychoeducation session. The topic included emotions and vulnerability. Counselor showed a TED talk by Almond Lint, Social Worker and educator who speaks on the power of vulnerability. Patients shared their interpretations of the video and discussed the difference between vulnerability and weakness.    Summary: Sobriety date remains 5/23. Patient presented for group today and was active and engaged in session. Patient shared that he had a good weekend in which he spent quality time with his wife watching a movie and cooking dinner. Patient attended 5 AA meetings and discussed his step work with his sponsor. His sponsor encouraged him to share during meetings which patient said made him nervous. Patient did share briefly in a meeting and found that it was helpful. Patient shared that he feels his wife is being more supportive of his recovery and does not get made when he "needs to go to 2 meetings in a row". Patient showed assertiveness and stuck up for himself with his wife and was able to communicate clearly without getting angry when he had to confront his wife for not being supportive of his recovery. Patient shared openly with  other group members and was able to improve his listening skills and not interrupt other group members as much as he did when he first began tx. Patient is doing well in his early recovery and responding to tx and AA stepwork. [name] Counselor   Family Program: Family present? No   Name of family member(s):   UDS collected: No Results: negative  AA/NA attended?: YesThursday, Friday, Saturday and Sunday  Sponsor?: Yes   Lysa Livengood, LCAS

## 2015-08-25 ENCOUNTER — Other Ambulatory Visit (HOSPITAL_COMMUNITY): Payer: 59 | Admitting: Psychology

## 2015-08-25 DIAGNOSIS — F1120 Opioid dependence, uncomplicated: Secondary | ICD-10-CM | POA: Diagnosis not present

## 2015-08-25 DIAGNOSIS — F1920 Other psychoactive substance dependence, uncomplicated: Secondary | ICD-10-CM

## 2015-08-25 DIAGNOSIS — F1020 Alcohol dependence, uncomplicated: Secondary | ICD-10-CM

## 2015-08-26 ENCOUNTER — Encounter (HOSPITAL_COMMUNITY): Payer: Self-pay | Admitting: Psychology

## 2015-08-26 LAB — PRESCRIPTION ABUSE MONITORING 17P, URINE
6-ACETYLMORPHINE, URINE: NEGATIVE ng/mL
AMPHETAMINE SCREEN URINE: NEGATIVE ng/mL
BARBITURATE SCREEN URINE: NEGATIVE ng/mL
BENZODIAZEPINE SCREEN, URINE: NEGATIVE ng/mL
Buprenorphine, Urine: NEGATIVE ng/mL
CANNABINOIDS UR QL SCN: NEGATIVE ng/mL
CARISOPRODOL/MEPROBAMATE, UR: NEGATIVE ng/mL
COCAINE(METAB.)SCREEN, URINE: NEGATIVE ng/mL
CREATININE(CRT), U: 132.3 mg/dL (ref 20.0–300.0)
EDDP, URINE: NEGATIVE ng/mL
Fentanyl, Urine: NEGATIVE pg/mL
MDMA SCREEN, URINE: NEGATIVE ng/mL
Meperidine Screen, Urine: NEGATIVE ng/mL
Methadone Screen, Urine: NEGATIVE ng/mL
NITRITE URINE, QUANTITATIVE: NEGATIVE ug/mL
OPIATE SCREEN URINE: NEGATIVE ng/mL
OXYCODONE+OXYMORPHONE UR QL SCN: NEGATIVE ng/mL
PHENCYCLIDINE QUANTITATIVE URINE: NEGATIVE ng/mL
Ph of Urine: 5.6 (ref 4.5–8.9)
Propoxyphene Scrn, Ur: NEGATIVE ng/mL
SPECIFIC GRAVITY: 1.021
Tapentadol, Urine: NEGATIVE ng/mL
Tramadol Screen, Urine: NEGATIVE ng/mL

## 2015-08-26 LAB — ETHYL GLUCURONIDE, URINE: Ethyl Glucuronide Screen, Ur: NEGATIVE ng/mL
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
## 2015-08-29 ENCOUNTER — Other Ambulatory Visit (HOSPITAL_COMMUNITY): Payer: 59 | Admitting: Psychology

## 2015-08-29 ENCOUNTER — Other Ambulatory Visit (HOSPITAL_COMMUNITY): Payer: Self-pay | Admitting: Medical

## 2015-08-29 DIAGNOSIS — F1020 Alcohol dependence, uncomplicated: Secondary | ICD-10-CM

## 2015-08-29 DIAGNOSIS — F1120 Opioid dependence, uncomplicated: Secondary | ICD-10-CM | POA: Diagnosis not present

## 2015-08-29 DIAGNOSIS — F1920 Other psychoactive substance dependence, uncomplicated: Secondary | ICD-10-CM

## 2015-08-30 ENCOUNTER — Encounter (HOSPITAL_COMMUNITY): Payer: Self-pay | Admitting: Psychology
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
## 2015-08-31 ENCOUNTER — Other Ambulatory Visit (HOSPITAL_COMMUNITY): Payer: 59 | Admitting: Psychology

## 2015-08-31 DIAGNOSIS — F1120 Opioid dependence, uncomplicated: Secondary | ICD-10-CM | POA: Diagnosis not present

## 2015-08-31 DIAGNOSIS — Z6372 Alcoholism and drug addiction in family: Secondary | ICD-10-CM

## 2015-08-31 DIAGNOSIS — F1920 Other psychoactive substance dependence, uncomplicated: Secondary | ICD-10-CM

## 2015-08-31 LAB — PRESCRIPTION ABUSE MONITORING 17P, URINE
6-ACETYLMORPHINE, URINE: NEGATIVE ng/mL
AMPHETAMINE SCREEN URINE: NEGATIVE ng/mL
BARBITURATE SCREEN URINE: NEGATIVE ng/mL
BENZODIAZEPINE SCREEN, URINE: NEGATIVE ng/mL
Buprenorphine, Urine: NEGATIVE ng/mL
CANNABINOIDS UR QL SCN: NEGATIVE ng/mL
CREATININE(CRT), U: 89.3 mg/dL (ref 20.0–300.0)
Carisoprodol/Meprobamate, Ur: NEGATIVE ng/mL
Cocaine (Metab) Scrn, Ur: NEGATIVE ng/mL
EDDP, URINE: NEGATIVE ng/mL
Fentanyl, Urine: NEGATIVE pg/mL
MDMA SCREEN, URINE: NEGATIVE ng/mL
METHADONE SCREEN, URINE: NEGATIVE ng/mL
Meperidine Screen, Urine: NEGATIVE ng/mL
NITRITE URINE, QUANTITATIVE: NEGATIVE ug/mL
OPIATE SCREEN URINE: NEGATIVE ng/mL
OXYCODONE+OXYMORPHONE UR QL SCN: NEGATIVE ng/mL
PH UR, DRUG SCRN: 5.7 (ref 4.5–8.9)
PROPOXYPHENE SCREEN URINE: NEGATIVE ng/mL
Phencyclidine Qn, Ur: NEGATIVE ng/mL
SPECIFIC GRAVITY: 1.02
TRAMADOL SCREEN, URINE: NEGATIVE ng/mL
Tapentadol, Urine: NEGATIVE ng/mL

## 2015-08-31 LAB — ETHYL GLUCURONIDE, URINE: Ethyl Glucuronide Screen, Ur: NEGATIVE ng/mL

## 2015-09-01 ENCOUNTER — Other Ambulatory Visit (HOSPITAL_COMMUNITY): Payer: 59 | Admitting: Psychology

## 2015-09-01 DIAGNOSIS — F1520 Other stimulant dependence, uncomplicated: Secondary | ICD-10-CM

## 2015-09-01 DIAGNOSIS — F1020 Alcohol dependence, uncomplicated: Secondary | ICD-10-CM

## 2015-09-01 DIAGNOSIS — F1920 Other psychoactive substance dependence, uncomplicated: Secondary | ICD-10-CM

## 2015-09-01 DIAGNOSIS — F1120 Opioid dependence, uncomplicated: Secondary | ICD-10-CM | POA: Diagnosis not present

## 2015-09-02 ENCOUNTER — Encounter (HOSPITAL_COMMUNITY): Payer: Self-pay | Admitting: Psychology

## 2015-09-02 NOTE — Progress Notes (Signed)
    Daily Group Progress Note  Program: CD-IOP   Group Time: 1-2:30   Participation Level: Active  Behavioral Response: Appropriate and Sharing  Type of Therapy: Process Group  Topic: Counselors met with patients for group process session. Patients shared about their recovery from drugs and alcohol. Patients were active and engaged in group. 2 Patients graduated from group successfully with some family members and AA sponsors in attendance. One group member shared that he had not gone to any meetings in over a week and some group members confronted him about this issue.     Group Time: 2:45-4  Participation Level: Active  Behavioral Response: Appropriate and Sharing  Type of Therapy: Psycho-education Group  Topic: Counselors met with patients for group psychoeducation session. The topic was mindfulness in recovery with an emphasis on developing a non-judgmental stance and ability to be more present with their emotional experience. Counselor used a handout from the DBT skills workbook on developing "wise mind" in recovery. Patients were thoughtfully engaged and showed interest in the subject. Some patients felt overwhelmed by the "depth" of the subject but counselor encouraged them to practice mindfulness in simple terms.    Summary: Patient was active and engaged in group today. He presented as upbeat and interested in the mindfulness instruction. He shared that he attended 2 AA meetings since our last group. He cooked and cleaned his house with his wife which he said felt good. He is working on having a more balanced diet and eating regularly since he often skips breakfast. Patient confronted another group member who was expressing resistance to attending AA. Patient seems to be making good progress in keeping sober, gaining awareness of his triggers, and working the steps with his [facility] sponsor. He continues to experience distress from conflict with his family members and feeling  "misunderstood" by others. Patient's sobriety date is 5/23. [name], Counselor   Family Program: Family present? No   Name of family member(s):   UDS collected: No Results: negative  AA/NA attended?: YesWednesday and Thursday  Sponsor?: Yes   Juni Glaab, LCAS

## 2015-09-05 ENCOUNTER — Encounter (HOSPITAL_COMMUNITY): Payer: Self-pay | Admitting: Psychology

## 2015-09-05 ENCOUNTER — Other Ambulatory Visit (HOSPITAL_COMMUNITY): Payer: Self-pay | Admitting: Medical

## 2015-09-05 ENCOUNTER — Other Ambulatory Visit (HOSPITAL_COMMUNITY): Payer: 59 | Admitting: Psychology

## 2015-09-05 DIAGNOSIS — F1020 Alcohol dependence, uncomplicated: Secondary | ICD-10-CM

## 2015-09-05 DIAGNOSIS — F1920 Other psychoactive substance dependence, uncomplicated: Secondary | ICD-10-CM

## 2015-09-05 DIAGNOSIS — F1120 Opioid dependence, uncomplicated: Secondary | ICD-10-CM | POA: Diagnosis not present
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
## 2015-09-06 ENCOUNTER — Encounter (HOSPITAL_COMMUNITY): Payer: Self-pay | Admitting: Psychology
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
## 2015-09-06 NOTE — Progress Notes (Signed)
Todd Cobb is a 40 y.o. male patient. CD-IOP: Treatment Plan Update. I met with the patient this morning for his weekly session and treatment plan update. He has attended 18 group sessions as of today. The patient has been using alcohol and drugs since he was a teen-ager and he has achieved, by far, the longest period of abstinence with 57 days today. The patient reported he had attended an [place] meeting this morning with his father. He had also gone to one last night. He explained that he and his father are driving to [place] at the end of this session and purchase a depth finder at [employer]. They are planning a fishing trip this fall out on the [place] and need a need unit. I explained the important of updating his treatment goals and the patient agreed that he is doing very well. He has remained alcohol and drug-free since entering the program and he is very pleased with himself. The patient is also building a strong network of recovery within the confines of AA, his sponsor, counselors and fellow group members. His wife has also attended some 12-step meetings with him and she is also attending Al-Anon meetings. The patient reported that today is the 24 the anniversary of his brother's death in a car accident. I reminded him that he should have told the group this yesterday, but the patient admitted he had forgotten it until today. The flowers on the church this Sunday will be in honor of his brother, [name], and the patient reported he was considering attending the service. He hasn't gone in many years. We discussed the importance of addressing and building a spiritual life and the patient noted there is a [employer] within walking distance of his home and the preacher has struggled with substance abuse himself. I encouraged the patient to consider going to this church, but he noted he wasn't sure his wife was ready to go back after losing her father in September. They both have significant  grief issues that have not been addressed and while his grief and loss about his brother is an older hurt, his wife is also still hurting from losing the father she was very close to. The session came to an end, but not before the treatment plan update was reviewed, signed and completed accordingly. The patient is making substantial progress against this chronic illness that he has struggled with since his late teens. There is much work to do, but he is patient and understands this is a long process that only continues with sobriety. He responded well to this intervention and his sobriety date remains 5/23.        Anetha Slagel, LCAS

## 2015-09-06 NOTE — Progress Notes (Signed)
    Daily Group Progress Note  Program: CD-IOP   Group Time: 1-2:30 pm  Participation Level: Active  Behavioral Response: Appropriate and Sharing  Type of Therapy: Process Group  Topic: Process:  The first half of group was spent in process. Members shared about any obstacles or challenges in early recovery.  One member checked-in with a new sobriety date of today. His relapse was analyzed at length.  A new group member was present and during this part of group, she shared about herself and what she needed from this program and the group. The program director met with 2 group members today. Drug tests were collected from 2 group members.  Group Time: 2:45- 4pm  Participation Level: Active  Behavioral Response: Appropriate  Type of Therapy: Psycho-education Group  Topic: Psycho-Ed: The second half of group was spent in a psycho-ed. the session was the conclusion of the previous group session and the topic was CBT or, specifically, the ABC(D) technique of identifying and challenging negative self-talk and beliefs. The handout provided in Monday's group session was reviewed and specific examples that group members identified were analyzed on the board. This psycho-ed included challenging the old inaccurate beliefs and replacing them with objective and accurate beliefs. One member explained that he replaces the "B with the D". The importance of challenging our thoughts and beliefs was reiterated throughout this session.   Summary: Sobriety date = 5/23. The patient reported he had attended 4 AA meetings since this group last met. He attended 2 meetings Monday evening and then again 2 on Tuesday. He speaks with his sponsor daily. The patient reported that he is still working with his wife on developing a consistent daily scheduled from Monday through Friday. He explained that a consistent routine would be very helpful for him and keep him organized and insure he accomplishes some things.  During  the psycho-ed, the patient was active in the discussion. He provided an example of his own dysfunctional beliefs. The belief that he is not good enough is one this patient has struggled with for, seemingly, ever. This counselor pointed out that this belief would be expected having grown up in a very dysfunctional and alcoholic family system. That they patient focus on validating himself and his self-worth by doing 'estimable' actions was encouraged. I also encouraged this patient to avoid interaction with his ex-wife for the time being until the legal time has passed where he possibly will be eligible for custody of his 2 children. The patient agreed that she knows 'all my buttons' and almost always presses them when they speak on the phone. The patient responded well to this intervention, but we will continue to focus on strengthening his own self-worth and gaining more control and emotional stability. He was applauded for his degree of vulnerability and transparency with the group.    Family Program: Family present? No   Name of family member(s):   UDS collected: Yes Results: negative  AA/NA attended?: YesMonday, Tuesday and Wednesday  Sponsor?: Yes   Ryder Chesmore, LCAS

## 2015-09-07 ENCOUNTER — Other Ambulatory Visit (HOSPITAL_COMMUNITY): Payer: 59 | Admitting: Psychology

## 2015-09-07 DIAGNOSIS — F1020 Alcohol dependence, uncomplicated: Secondary | ICD-10-CM

## 2015-09-07 DIAGNOSIS — F1120 Opioid dependence, uncomplicated: Secondary | ICD-10-CM | POA: Diagnosis not present

## 2015-09-07 DIAGNOSIS — F1520 Other stimulant dependence, uncomplicated: Secondary | ICD-10-CM

## 2015-09-07 LAB — PRESCRIPTION ABUSE MONITORING 17P, URINE
6-Acetylmorphine, Urine: NEGATIVE ng/mL
AMPHETAMINE SCREEN URINE: NEGATIVE ng/mL
BARBITURATE SCREEN URINE: NEGATIVE ng/mL
BENZODIAZEPINE SCREEN, URINE: NEGATIVE ng/mL
BUPRENORPHINE, URINE: NEGATIVE ng/mL
CANNABINOIDS UR QL SCN: NEGATIVE ng/mL
CARISOPRODOL/MEPROBAMATE, UR: NEGATIVE ng/mL
COCAINE(METAB.)SCREEN, URINE: NEGATIVE ng/mL
CREATININE(CRT), U: 134.5 mg/dL (ref 20.0–300.0)
EDDP, Urine: NEGATIVE ng/mL
Fentanyl, Urine: NEGATIVE pg/mL
MDMA SCREEN, URINE: NEGATIVE ng/mL
MEPERIDINE SCREEN, URINE: NEGATIVE ng/mL
METHADONE SCREEN, URINE: NEGATIVE ng/mL
Nitrite Urine, Quantitative: NEGATIVE ug/mL
OPIATE SCREEN URINE: NEGATIVE ng/mL
OXYCODONE+OXYMORPHONE UR QL SCN: NEGATIVE ng/mL
PHENCYCLIDINE QUANTITATIVE URINE: NEGATIVE ng/mL
Ph of Urine: 6.1 (ref 4.5–8.9)
Propoxyphene Scrn, Ur: NEGATIVE ng/mL
SPECIFIC GRAVITY: 1.025
TAPENTADOL, URINE: NEGATIVE ng/mL
Tramadol Screen, Urine: NEGATIVE ng/mL

## 2015-09-07 LAB — ETHYL GLUCURONIDE, URINE: Ethyl Glucuronide Screen, Ur: NEGATIVE ng/mL

## 2015-09-08 ENCOUNTER — Other Ambulatory Visit (HOSPITAL_COMMUNITY): Payer: 59 | Admitting: Psychology

## 2015-09-08 DIAGNOSIS — F1920 Other psychoactive substance dependence, uncomplicated: Secondary | ICD-10-CM

## 2015-09-11 ENCOUNTER — Encounter (HOSPITAL_COMMUNITY): Payer: Self-pay | Admitting: Psychology

## 2015-09-11 NOTE — Progress Notes (Signed)
    Daily Group Progress Note  Program: CD-IOP   Group Time: 1-2:30 pm  Participation Level: Active  Behavioral Response: Sharing  Type of Therapy: Process Group  Topic: Process: the first half of group was spent in process. Members shared about challenges and successes in early recovery. Included in their check-in, are the things that they did since we last met that enhance or support their sobriety. The program director met with the 2 newest members. Two random drug tests were collected today while the 5 collected on Monday were returned.  Group Time: 2:45 4 pm  Participation Level: Active  Behavioral Response: Appropriate  Type of Therapy: Psycho-education Group  Topic: Psycho-Ed: Mind, Body, Spirit, Part 2. The second half of group was spent in a psycho-ed. The session included members completing the handout provided on Monday. The discussion that followed focused on identifying how they 'feed' their mind. The session focused mostly in values and the question was 'how do you value yourself'? This proved challenging to a group of caretakers who have difficulty sitting still, let alone considering their worth outside of what they do. The session proved challenging with group members agreeing that this issue will have to be addressed in their recovery.    Summary: Sobriety date = 5/23. The patient reported that he had met with his counselor yesterday and then traveled to [place] with his father to buy a depth finder for their boat. He had enjoyed spending the day with his father and noted that now that they are both sober it is really nice to be able to talk with him. The patient shared that yesterday is the anniversary of his brother's death. He was killed in a car accident and this patient has never really mourned this loss. His father was drinking and his family never spoke about this devastating event. The patient reported he had attended 3 AA meetings since our last group  session. He provided good support to the newest group member and encouraged him not to focus on the legal issues; 'it will be okay'. In the psycho-ed, the patient reported he has a tough time focusing on any one thing for any length of time. He admitted he doesn't have a particularly positive outlook and generally has a sense of 'impending doom'. The patient reported there are some things he doesn't understand, but he has been told by his sponsor and other folks with a lot of sobriety to just go to meetings, listen and it will gradually make sense. The patient admitted he must have faith that those folks and the counselors here want the best for him and that is not easy for him. The patient displayed a degree of humility and vulnerability as he shared about his struggles. He responded well to this intervention.   Family Program: Family present? No   Name of family member(s):   UDS collected: No Results:   AA/NA attended? Yes, Wednesday evening  Sponsor?: Yes   Kathreen Dileo, LCAS

## 2015-09-11 NOTE — Progress Notes (Deleted)
    Daily Group Progress Note  Program: CD-IOP   Group Time: 1-2:30 pm  Participation Level: Active  Behavioral Response: Sharing, Rigid, Rationalizing and Minimizing  Type of Therapy: Process Group  Topic: Process: the first half of group was spent in process. Members identified everything they had done to support their recovery since we met yesterday. There was good exchange and feedback among members. One member struggled with identifying his feelings so the "Feeling Wheel" was passed around with members identifying their feelings. This counselor reminded the group that she and her co-therapist are doing everything they can to help each group member in developing strategies and skills in order that they will keep their addictions in remission. The question I asked was, "are you, in turn, doing everything you can to learn how to manage your disease"?  Group Time: 2:45-4 pm Participation Level: Minimal  Behavioral Response: Sharing  Type of Therapy: Psycho-education Group  Topic: Psycho-Ed: "Self-Care". The second half of group was spent in a psycho-ed. The topic was self-care and the importance of a balanced life in early recovery was emphasized. Critical issues like diet, sleep and exercise were discussed and some healthy parameters provided in each instance. As the session neared an end, members shared about their plans for the upcoming weekend. One member reminded his fellow group members that "If you don't have a plan, you have got a plan to drink".   Summary: Sobriety date = 5/23. The patient reported he had attended 1 AA meeting yesterday. This morning he had gone to the dermatologists and had some warts removed. He described himself as feeling 'pain'. He noted that he had gotten these warts on his feet when he was in jail. When asked how he was feeling, other than the pain of the removal, the patient reported that he felt "optimistic and accepted". He had cooked dinner for his  wife and spent a quiet evening after returning from the [place] meeting. In the psycho-ed, the patient reported he is working to eat better. Too often he has coffee and a cigarette and nothing else when he first gets up. He was in very good health at one time, but he has let himself go and gained a lot of weight. The patient expressed the belief that he is addressing his spiritual self and learning to "let go and let God'. He reported his father and he were going back down to [place] tomorrow to pick up the depth finder because it had to be ordered. The patient provided helpful feedback and responded well to this intervention.   Family Program: Family present? No   Name of family member(s):   UDS collected: No Results:   AA/NA attended?: No  Sponsor?: No   Alliene Klugh, LCAS

## 2015-09-12 ENCOUNTER — Other Ambulatory Visit (HOSPITAL_COMMUNITY): Payer: Self-pay | Admitting: Medical

## 2015-09-12 ENCOUNTER — Other Ambulatory Visit (HOSPITAL_COMMUNITY): Payer: 59 | Admitting: Psychology

## 2015-09-12 DIAGNOSIS — Z6372 Alcoholism and drug addiction in family: Secondary | ICD-10-CM

## 2015-09-12 DIAGNOSIS — F4310 Post-traumatic stress disorder, unspecified: Secondary | ICD-10-CM

## 2015-09-12 DIAGNOSIS — F1120 Opioid dependence, uncomplicated: Secondary | ICD-10-CM | POA: Diagnosis not present

## 2015-09-12 DIAGNOSIS — F1920 Other psychoactive substance dependence, uncomplicated: Secondary | ICD-10-CM

## 2015-09-12 NOTE — Progress Notes (Signed)
Daily Group Progress Note  Program: CD-IOP   Group Time: 1-2:30 pm  Participation Level: Active  Behavioral Response: Appropriate and Sharing  Type of Therapy: Process Group  Topic: Process: the first half of group was spent in process. Members shared about the past weekend and the things that they had done to support their sobriety as well as any challenges that presented themselves.  One member arrived a few minutes late and appeared out of sorts. He checked-in with a sobriety date of today. He was asked about this and he reported one of 'my homeboys' is in the hospital after suffering a stroke. He is in ICU and it doesn't look good. He reported he needed to be there with his friend and couldn't stay. He got up and left. Drug tests were collected from all group members today and the medical director met with the graduating member.   Group Time: 2:45- 4pm  Participation Level: Active  Behavioral Response: Sharing  Type of Therapy: Psycho-education Group  Topic: Psycho-Ed: Bio-Psycho-Social-Spiritual/Graduation. The second half of group was spent in a psycho-ed on the 4 different elements that lead to addiction. These different elements were discussed in detail. It was noted that the only one that is genetically-based was the biological element. The characteristics that might be displayed based were identified. Members followed up by identifying the ways one in recovery could contribute to strengthening these elements.  As the session neared an end, a graduation ceremony was held honoring a Software engineer. Her husband was brought in to sit with her during the ceremony. Kind words of hope, admiration and courage were shared. The patient shared her hopes for her fellow group members and thanked everyone for helping her along the way.    Summary: Sobriety date = 5/23. The patient reported he had attended one AA meeting where a recent sudden death of an [place] member was shared and mourned  by all present. He had also learned of an old friend who had overdosed on heroin. The patient reported he felt that his friend was "finally at peace" because he had never seemed very comfortable or content in his life. The patient was almost despondent in group today and I pointed this out. He admitted that he had thought, "it would be different than this". The group realized he had picked up his 60-day chip today and applauded this news. Another member reminded him that she had also felt very uncomfortable prior to picking up her 60-day chip and she believes that this is related to [place]. The patient admitted that he realizes this is a process and it never ends. He had attended a total of 4 meetings since we last met, but questioned why he had not attended a meeting yesterday? The patient reported his foot is hurting him. He had some warts frozen last Thursday and they have blistered badly. The patient is also going to the dentist tomorrow after almost 10 years of absence and he is both embarrassed and regretting what they will find. Finally, the patient reported he and his wife had purchased 2 tickets to a concert in [place]. He knew it would be a huge trigger, but "[employer]" is one of his wife's favorite groups. He asked the group to provided him with some feedback about whether he should go? In the psycho-ed, the patient could identify several characteristics common in ACOA's. Growing up with an alcoholic father, the patient has had little mentoring or example and when his younger siblings were  born, he was kind of ignored. He had learned not to ask any one for help and to do it himself. He also pointed out that his peer group had also been a crucial element in his alcohol and drug use and reported that he was 40 yo when he got drunk the first time. As noted, this patient presented in an unusual way than previously seen. He admitted he had not slept well on Saturday evening and was tired. Group members reminded  him of how dangerous being sleep-deprived can be for folks in early recovery. He received helpful feedback and encouragement from his fellow group members and he responded well by expressing his feelings despite being negative or discouraging. The patient shared words of gratitude and hope to the member who was leaving today. We will continue to follow closely in the days ahead.    Family Program: Family present? No   Name of family member(s):   UDS collected: Yes Results: pending  AA/NA attended?: YesThursday, Friday and Saturday  Sponsor?: Yes   Lavontay Kirk, LCAS

## 2015-09-14 ENCOUNTER — Other Ambulatory Visit (HOSPITAL_COMMUNITY): Payer: 59 | Admitting: Psychology

## 2015-09-14 DIAGNOSIS — F4310 Post-traumatic stress disorder, unspecified: Secondary | ICD-10-CM

## 2015-09-14 DIAGNOSIS — F1920 Other psychoactive substance dependence, uncomplicated: Secondary | ICD-10-CM

## 2015-09-14 DIAGNOSIS — F1120 Opioid dependence, uncomplicated: Secondary | ICD-10-CM | POA: Diagnosis not present

## 2015-09-15 ENCOUNTER — Other Ambulatory Visit (HOSPITAL_COMMUNITY): Payer: 59 | Admitting: Psychology

## 2015-09-15 DIAGNOSIS — F4310 Post-traumatic stress disorder, unspecified: Secondary | ICD-10-CM

## 2015-09-15 DIAGNOSIS — F1020 Alcohol dependence, uncomplicated: Secondary | ICD-10-CM

## 2015-09-15 DIAGNOSIS — F1920 Other psychoactive substance dependence, uncomplicated: Secondary | ICD-10-CM

## 2015-09-15 DIAGNOSIS — F1120 Opioid dependence, uncomplicated: Secondary | ICD-10-CM | POA: Diagnosis not present

## 2015-09-15 LAB — PRESCRIPTION ABUSE MONITORING 17P, URINE
6-Acetylmorphine, Urine: NEGATIVE ng/mL
Amphetamine Scrn, Ur: NEGATIVE ng/mL
BARBITURATE SCREEN URINE: NEGATIVE ng/mL
BENZODIAZEPINE SCREEN, URINE: NEGATIVE ng/mL
BUPRENORPHINE, URINE: NEGATIVE ng/mL
CANNABINOIDS UR QL SCN: NEGATIVE ng/mL
CARISOPRODOL/MEPROBAMATE, UR: NEGATIVE ng/mL
COCAINE(METAB.)SCREEN, URINE: NEGATIVE ng/mL
Creatinine(Crt), U: 57 mg/dL (ref 20.0–300.0)
EDDP, Urine: NEGATIVE ng/mL
FENTANYL, URINE: NEGATIVE pg/mL
MDMA Screen, Urine: NEGATIVE ng/mL
MEPERIDINE SCREEN, URINE: NEGATIVE ng/mL
Methadone Screen, Urine: NEGATIVE ng/mL
Nitrite Urine, Quantitative: NEGATIVE ug/mL
OXYCODONE+OXYMORPHONE UR QL SCN: NEGATIVE ng/mL
Opiate Scrn, Ur: NEGATIVE ng/mL
Ph of Urine: 6.7 (ref 4.5–8.9)
Phencyclidine Qn, Ur: NEGATIVE ng/mL
Propoxyphene Scrn, Ur: NEGATIVE ng/mL
SPECIFIC GRAVITY: 1.011
TRAMADOL SCREEN, URINE: NEGATIVE ng/mL
Tapentadol, Urine: NEGATIVE ng/mL

## 2015-09-15 LAB — ETHYL GLUCURONIDE, URINE: ETHYL GLUCURONIDE SCREEN, UR: NEGATIVE ng/mL

## 2015-09-15 NOTE — Progress Notes (Signed)
    Daily Group Progress Note  Program: CD-IOP   09/15/2015 Todd Cobb [id number]   Sobriety Date: 07-12-15  Patient met with Medical Director: (Y/N) N  Group Time: 1-2:30  Participation Level: Active  Behavioral Response: Appropriate and Sharing  Type of Therapy: Process Group  Interventions: CBT and Motivational Interviewing  Topic: The group used the first half of group to process events that occurred outside of group related to recovery.  The counselor and members were able to provide feedback to individuals struggling with certain aspects of sobriety. [name], Counselor   Group Time: 2:45-4  Participation Level: Active  Behavioral Response: Appropriate and Sharing  Type of Therapy: Psycho-education Group  Interventions: Other: Family Sculpture  Topic: The second half of group focused on psychoeducation.  The group employed a psychodrama technique, Family Sculpting, to discuss families and family structures.  Each member is provided the opportunity to share a pivotal moment related to their own family through sculpting group members to reenact the scene.  This allows the individual, as well as the other members, to discuss the dynamics of the family, feelings associated with the scene, and how the moment is part of their addiction.   Summary: Client reports he is "feeling great" after experiencing a hard day on Monday.  Client shared he received a call and text from his ex-wife about his children.  This has been a trigger in the past, but client stated he handled it well by not responding, closing the line of communication and potential for being triggered and experiencing cravings.   UDS collected: No Results: negative  AA/NA attended?: YesMonday and Tuesday  Sponsor?: Yes   Brandon Melnick, LCAS 09/15/2015 11:19 AM

## 2015-09-19 ENCOUNTER — Other Ambulatory Visit (HOSPITAL_COMMUNITY): Payer: 59 | Admitting: Psychology

## 2015-09-19 DIAGNOSIS — F1920 Other psychoactive substance dependence, uncomplicated: Secondary | ICD-10-CM

## 2015-09-19 DIAGNOSIS — F1120 Opioid dependence, uncomplicated: Secondary | ICD-10-CM | POA: Diagnosis not present

## 2015-09-19 DIAGNOSIS — F4310 Post-traumatic stress disorder, unspecified: Secondary | ICD-10-CM

## 2015-09-19 DIAGNOSIS — F1020 Alcohol dependence, uncomplicated: Secondary | ICD-10-CM

## 2015-09-19 NOTE — Progress Notes (Signed)
    Daily Group Progress Note  Program: CD-IOP   09/19/2015 Todd Cobb [id number] _0 @  Sobriety Date: 07/12/2015  Patient met with Medical Director: (Y/N) N  Group Time: 1-2:30  Participation Level: Active  Behavioral Response: Appropriate and Sharing  Type of Therapy: Process Group  Interventions: CBT, Motivational Interviewing and Solution Focused  Topic: The group used the first half of group to process events that occurred outside of group related to recovery.  The counselor and members were able to provide feedback to individuals struggling with certain aspects of sobriety.   Group Time: 2:45-4  Participation Level: Active  Behavioral Response: Appropriate and Sharing  Type of Therapy: Psycho-education Group  Interventions: Other: Family Sculpture  Topic: The second half of group focused on psycho-education.  The group employed a psychodrama technique, Family Sculpting, to discuss families and family structures.  Each member is provided the opportunity to share a pivotal moment related to their own family through sculpting group members to reenact the scene.  This allows the individual, as well as the other members, to discuss the dynamics of the family, feelings associated with the scene, and how the moment is part of their addiction.    Summary: Patient attended two meetings since last group, and also talked with sponsor. 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 Patient also shared that he wanted to start looking at work opportunities and going back to church. Patient demonstrated that he wanted to be a responsible "leader" in his family again. Patient participated in the family structure activity, and demonstrated the scenario of his alcoholic family. The scenario was very powerful for the patient. The counselor helped to process the feelings of his family sculpture, and to see how those feelings are still currently affecting him.  The counselor also processed feelings that the client shared about the loss of his brother, and the impact that played in his drug use. [name], Counselor   UDS collected: No Results: negative  AA/NA attended?: YesWednesday  Sponsor?: Yes   Brandon Melnick, LCAS 09/19/2015 9:56 AM

## 2015-09-20 NOTE — Progress Notes (Signed)
    Daily Group Progress Note  Program: CD-IOP   09/20/2015 Todd Cobb [id number] Polysubstance dependence including opioid type drug with complication, continuous use ([place])  Post traumatic stress disorder (PTSD)  Severe alcohol use disorder ([place])    Sobriety Date:07/12/15  Patient met with Medical Director: (Y/N) n  Group Time: 1-2:30  Participation Level: Active  Behavioral Response: Appropriate and Sharing  Type of Therapy: Process Group  Interventions: CBT, Motivational Interviewing, Solution Focused and Reframing  Topic: Patients and counselor discussed issues related to patient's sobriety and challenges they are facing. Counselor worked to grow motivation to change behavior and hold members accountable to the goals they had set. Patients participated in an outdoor activity aimed at [place] in recovery-thinking. One new patient met with Investment banker, operational.    Group Time: 2:45-4  Participation Level: Active  Behavioral Response: Appropriate and Sharing  Type of Therapy: Psycho-education Group  Interventions: Psychosocial Skills: Recognizing triggers  Topic: Patients and counselor discussed "Red and Green Flags" from Seeking Safety curriculum for PTSD and Substance Abuse. Patients shared about their most-likely "high risk situations" and how they could prevent a return to use.    Summary: Patient reported that he attended 5 AA meetings over the weekend and felt good about his recovery. He was able to have a "polite and constructive" conversation with his ex-wife about how to best support his kids and what they need. Patient was engaged in group and able to identify talking with his sponsor, attending meetings, and taking time to relax as helpful for his recovery. Patient shared that his high risk situations include isolation, denying help or tx, losing will to change. Counselor returned negative drug test results. Patient responded well to Seeking  Safety topic of high risk situations. Todd Cobb, Counselor   UDS collected: No Results: negative  AA/NA attended?: Threasa Beards, Friday, Saturday and Sunday  Sponsor?: Yes   Brandon Melnick, LCAS 09/20/2015 1:52 PM

## 2015-09-21 ENCOUNTER — Other Ambulatory Visit (HOSPITAL_COMMUNITY): Payer: Self-pay | Admitting: Medical

## 2015-09-21 ENCOUNTER — Other Ambulatory Visit (HOSPITAL_COMMUNITY): Payer: 59 | Attending: Psychiatry | Admitting: Psychology

## 2015-09-21 DIAGNOSIS — F4310 Post-traumatic stress disorder, unspecified: Secondary | ICD-10-CM

## 2015-09-21 DIAGNOSIS — F1020 Alcohol dependence, uncomplicated: Secondary | ICD-10-CM

## 2015-09-21 DIAGNOSIS — F1120 Opioid dependence, uncomplicated: Secondary | ICD-10-CM | POA: Diagnosis present

## 2015-09-21 DIAGNOSIS — F1920 Other psychoactive substance dependence, uncomplicated: Secondary | ICD-10-CM

## 2015-09-22 ENCOUNTER — Other Ambulatory Visit (HOSPITAL_COMMUNITY): Payer: 59 | Admitting: Psychology

## 2015-09-22 DIAGNOSIS — F4310 Post-traumatic stress disorder, unspecified: Secondary | ICD-10-CM

## 2015-09-22 DIAGNOSIS — F1920 Other psychoactive substance dependence, uncomplicated: Secondary | ICD-10-CM

## 2015-09-22 DIAGNOSIS — F1020 Alcohol dependence, uncomplicated: Secondary | ICD-10-CM

## 2015-09-22 NOTE — Progress Notes (Signed)
    Daily Group Progress Note  Program: CD-IOP   09/22/2015 ATANACIO MELNYK [id number]  Diagnosis:  Polysubstance dependence including opioid type drug with complication, continuous use ([facility])  Post traumatic stress disorder (PTSD)  Severe alcohol use disorder ([place])   Sobriety Date: 07/12/15  Patient met with Medical Director: (Y/N) N  Group Time: 1-2:30  Participation Level: Active  Behavioral Response: Appropriate and Sharing  Type of Therapy: Process Group  Interventions: CBT, Motivational Interviewing, Solution Focused and Reframing  Topic: The group used the first half of group to process events that occurred outside of group related to recovery.  The counselor and members were able to provide feedback to individuals struggling with certain aspects of sobriety.     Group Time: 2:45-4  Participation Level: Active  Behavioral Response: Appropriate and Sharing  Type of Therapy: Psycho-education Group  Interventions:   Topic: The second half of group focused on psycho-education.  The group employed a handout on key points about red and green flags in sobriety. The handout addresses flags of spiraling, talking about feelings, getting help from others, budding, and the most vulnerable time of relapse. The handout helps to bring awareness of potential weaknesses in recovery.    Summary: Clients reported sobriety date remains as 07/12/2015. Patient attended five meetings since Monday, and reported that he really wants to take his recovery seriously. Patient shared that he took his mother -in-law to treatment on Tuesday, and that he enjoyed getting to spend the day with her. Patient educated fellow group members how to go about finding a temporary sponsor. Patient shared that his anger towards other is decreasing and he is experiencing more peace. He shared that he continues to struggle with cravings and thoughts of using. [name], Counselor   UDS collected: Yes Results:  negative  AA/NA attended?: Threasa Beards, Tuesday and Wednesday  Sponsor?: Yes   Brandon Melnick, LCAS 09/22/2015 4:23 PM

## 2015-09-23 ENCOUNTER — Telehealth (HOSPITAL_COMMUNITY): Payer: Self-pay | Admitting: Psychology

## 2015-09-23 LAB — PRESCRIPTION ABUSE MONITORING 17P, URINE
6-Acetylmorphine, Urine: NEGATIVE ng/mL
AMPHETAMINE SCREEN URINE: NEGATIVE ng/mL
BARBITURATE SCREEN URINE: NEGATIVE ng/mL
BENZODIAZEPINE SCREEN, URINE: NEGATIVE ng/mL
BUPRENORPHINE, URINE: NEGATIVE ng/mL
CANNABINOIDS UR QL SCN: NEGATIVE ng/mL
Carisoprodol/Meprobamate, Ur: NEGATIVE ng/mL
Cocaine (Metab) Scrn, Ur: NEGATIVE ng/mL
Creatinine(Crt), U: 70.7 mg/dL (ref 20.0–300.0)
EDDP, Urine: NEGATIVE ng/mL
FENTANYL, URINE: NEGATIVE pg/mL
MDMA Screen, Urine: NEGATIVE ng/mL
METHADONE SCREEN, URINE: NEGATIVE ng/mL
Meperidine Screen, Urine: NEGATIVE ng/mL
Nitrite Urine, Quantitative: NEGATIVE ug/mL
OPIATE SCREEN URINE: NEGATIVE ng/mL
OXYCODONE+OXYMORPHONE UR QL SCN: NEGATIVE ng/mL
PH UR, DRUG SCRN: 5.8 (ref 4.5–8.9)
PHENCYCLIDINE QUANTITATIVE URINE: NEGATIVE ng/mL
PROPOXYPHENE SCREEN URINE: NEGATIVE ng/mL
SPECIFIC GRAVITY: 1.011
TAPENTADOL, URINE: NEGATIVE ng/mL
TRAMADOL SCREEN, URINE: NEGATIVE ng/mL

## 2015-09-23 LAB — ETHYL GLUCURONIDE, URINE: Ethyl Glucuronide Screen, Ur: NEGATIVE ng/mL

## 2015-09-26 ENCOUNTER — Other Ambulatory Visit (HOSPITAL_COMMUNITY): Payer: Self-pay | Admitting: Medical

## 2015-09-26 ENCOUNTER — Other Ambulatory Visit (HOSPITAL_COMMUNITY): Payer: 59 | Admitting: Psychology

## 2015-09-26 DIAGNOSIS — F1020 Alcohol dependence, uncomplicated: Secondary | ICD-10-CM

## 2015-09-26 DIAGNOSIS — F4310 Post-traumatic stress disorder, unspecified: Secondary | ICD-10-CM

## 2015-09-26 DIAGNOSIS — F1920 Other psychoactive substance dependence, uncomplicated: Secondary | ICD-10-CM

## 2015-09-27 NOTE — Progress Notes (Signed)
    Daily Group Progress Note  Program: CD-IOP   09/27/2015 HARLEM BULA [id number]  Diagnosis:  Polysubstance dependence including opioid type drug with complication, continuous use ([place])  Post traumatic stress disorder (PTSD)  Severe alcohol use disorder ([place])   Sobriety Date: 07/12/15   Group Time: 1-2:30  Participation Level: Active  Behavioral Response: Appropriate and Sharing  Type of Therapy: Process Group  Interventions: Motivational Interviewing and Solution Focused  Topic: Patients met with counselors for group process session for 1.5 hours. Discussion focused on patient recovery from mind-altering drugs and alcohol. Patients were active and engaged. Urinalysis tests were collected for multiple patients. One patient graduated today and met with the program director for her discharge. One new patient was present and met with the program director.      Group Time: 2:45-4  Participation Level: Active  Behavioral Response: Appropriate and Sharing  Type of Therapy: Psycho-education Group  Interventions: Assertiveness Training and Other: asking for help  Topic: Patients met with counselors for group psychoeducation session with a lesson from "Seeking Safety" on "Asking for Help". Patients shared about their experiences with asking others for help. Counselors encouraged patients to try new methods and reframed the "weakness" associated with asking for help as "empowerment".    Summary: Patient presented for group with an engaged but somewhat distracted affected. He shared that he had a difficult weekend due to some legal issues that involved his past actions. Patient shared that he felt hopeless at first but quickly called his wife, sponsor, and "took some time to create a plan". He reported that he avoided AA meetings over the weekend and instead did self care and worried about his legal status while sitting at home. Patient presented as agitated towards  counselors who asked why patient had not been to a 12 step meeting in 3 days. Patient eventually calmed down in group and shared that he talked with multiple people over the weekend about not going to [place] and felt safe to stay home. He stated that he will attend a meeting tonight after he handles his legal issues. A urinalysis test was collected from patient today. [name], Counselor   UDS collected: Yes Results: negative  AA/NA attended?: YesFriday  Sponsor?: Yes   Brandon Melnick, [place] 09/27/2015 3:47 PM

## 2015-09-28 ENCOUNTER — Encounter (HOSPITAL_COMMUNITY): Payer: Self-pay | Admitting: Psychology

## 2015-09-28 ENCOUNTER — Other Ambulatory Visit (HOSPITAL_COMMUNITY): Payer: 59 | Admitting: Psychology

## 2015-09-28 DIAGNOSIS — F1020 Alcohol dependence, uncomplicated: Secondary | ICD-10-CM

## 2015-09-28 DIAGNOSIS — F4310 Post-traumatic stress disorder, unspecified: Secondary | ICD-10-CM

## 2015-09-28 DIAGNOSIS — F1520 Other stimulant dependence, uncomplicated: Secondary | ICD-10-CM
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
## 2015-09-29 ENCOUNTER — Other Ambulatory Visit (HOSPITAL_COMMUNITY): Payer: 59 | Admitting: Psychology

## 2015-09-29 DIAGNOSIS — F1020 Alcohol dependence, uncomplicated: Secondary | ICD-10-CM

## 2015-09-29 DIAGNOSIS — F4310 Post-traumatic stress disorder, unspecified: Secondary | ICD-10-CM

## 2015-09-29 DIAGNOSIS — F1520 Other stimulant dependence, uncomplicated: Secondary | ICD-10-CM

## 2015-09-30 LAB — PRESCRIPTION ABUSE MONITORING 17P, URINE
6-Acetylmorphine, Urine: NEGATIVE ng/mL
Amphetamine Scrn, Ur: NEGATIVE ng/mL
BARBITURATE SCREEN URINE: NEGATIVE ng/mL
BENZODIAZEPINE SCREEN, URINE: NEGATIVE ng/mL
BUPRENORPHINE, URINE: NEGATIVE ng/mL
CANNABINOIDS UR QL SCN: NEGATIVE ng/mL
CARISOPRODOL/MEPROBAMATE, UR: NEGATIVE ng/mL
Cocaine (Metab) Scrn, Ur: NEGATIVE ng/mL
Creatinine(Crt), U: 105.1 mg/dL (ref 20.0–300.0)
EDDP, URINE: NEGATIVE ng/mL
FENTANYL, URINE: NEGATIVE pg/mL
MDMA Screen, Urine: NEGATIVE ng/mL
MEPERIDINE SCREEN, URINE: NEGATIVE ng/mL
Methadone Screen, Urine: NEGATIVE ng/mL
NITRITE URINE, QUANTITATIVE: NEGATIVE ug/mL
OXYCODONE+OXYMORPHONE UR QL SCN: NEGATIVE ng/mL
Opiate Scrn, Ur: NEGATIVE ng/mL
Ph of Urine: 5.5 (ref 4.5–8.9)
Phencyclidine Qn, Ur: NEGATIVE ng/mL
Propoxyphene Scrn, Ur: NEGATIVE ng/mL
SPECIFIC GRAVITY: 1.024
TRAMADOL SCREEN, URINE: NEGATIVE ng/mL
Tapentadol, Urine: NEGATIVE ng/mL

## 2015-09-30 LAB — ETHYL GLUCURONIDE, URINE: Ethyl Glucuronide Screen, Ur: NEGATIVE ng/mL

## 2015-10-03 ENCOUNTER — Encounter (HOSPITAL_COMMUNITY): Payer: Self-pay | Admitting: Psychology

## 2015-10-03 ENCOUNTER — Other Ambulatory Visit (HOSPITAL_COMMUNITY): Payer: 59 | Admitting: Psychology

## 2015-10-03 ENCOUNTER — Other Ambulatory Visit (HOSPITAL_COMMUNITY): Payer: Self-pay | Admitting: Medical

## 2015-10-03 DIAGNOSIS — F1020 Alcohol dependence, uncomplicated: Secondary | ICD-10-CM

## 2015-10-03 DIAGNOSIS — F1420 Cocaine dependence, uncomplicated: Secondary | ICD-10-CM

## 2015-10-03 DIAGNOSIS — F1920 Other psychoactive substance dependence, uncomplicated: Secondary | ICD-10-CM

## 2015-10-03 DIAGNOSIS — F4310 Post-traumatic stress disorder, unspecified: Secondary | ICD-10-CM

## 2015-10-03 NOTE — Progress Notes (Signed)
    Daily Group Progress Note  Program: CD-IOP   10/03/2015 OSHEA PERCIVAL [id number]  Diagnosis:  Polysubstance dependence including opioid type drug with complication, continuous use (HCC)  Post traumatic stress disorder (PTSD)  Severe alcohol use disorder (HCC)  Cocaine use disorder, severe, dependence ([place])   Sobriety Date: 07/12/15  Group Time: 1-2:30  Participation Level: Active  Behavioral Response: Appropriate and Sharing  Type of Therapy: Process Group  Interventions: CBT, Motivational Interviewing and Solution Focused  Topic: Counselors met with patients for group process session. One member successfully graduated from the program. Some members met with program director to discuss medication management. All members were active and engaged in the discussion concerning recovery from mind-altering drugs and alcohol.   Group Time: 2:45-4  Participation Level: Active  Behavioral Response: Appropriate and Sharing  Type of Therapy: Psycho-education Group  Interventions: CBT, Solution Focused, Anger Management Training and Meditation: guided meditation  Topic: Counselors met with patients for group psychoeducation session using Mindfulness-based Relapse Prevention. Counselor led a 10 minute meditation via YouTube which guided patients through a mindfulness experience. Patients commented that it was helpful and seemed shorter than expected. Most members were active and engaged. Additionally, counselor led a discussion on relapse prevention on recognizing "external triggers" such as people places and things that lead to returning to use. A handout from the [employer] curriculum was utilized. Drug tests were collected from most members.    Summary: Patient presented as active, engaged, upbeat and well-groomed during sessions. He shared that he attended 3 AA meetings over the weekend, spoke with his sponsor daily, and spent time with his family and children from another  marriage. Pt shared this was a surprisingly good experience and he felt hopeful to reconnect with his son who he had not spent significant time with in over 1 year. Patient reported he felt "proud". He also shared about his decision to chair an [place] meeting which he is excited and nervous about. Patient is scheduled to graduate from group in 2 sessions. He has had all negative drug tests and seems to be doing fairly well in his early recovery. He shares openly about his negative feelings and is working directly with his sponsor on the 12 steps and on growing sober friendships. A drug test was collected from patient. Drug test results from a previous test were return; results were negative. [name], Counselor   UDS collected: Yes Results: negative  AA/NA attended?: YesFriday and Saturday  Sponsor?: Yes   Brandon Melnick, LCAS 10/03/2015 4:31 PM

## 2015-10-04 ENCOUNTER — Encounter (HOSPITAL_COMMUNITY): Payer: Self-pay | Admitting: Psychology

## 2015-10-04 NOTE — Progress Notes (Signed)
    Daily Group Progress Note  Program: CD-IOP   10/04/2015 YUVAAN OLANDER [id number]  Diagnosis:  No diagnosis found.   Sobriety Date: 5/23  Group Time: 1-2:30 pm  Participation Level: Active  Behavioral Response: Appropriate  Type of Therapy: Psycho-education Group  Interventions: Strength-based  Topic: Psycho-Ed: the first part of group was spent with a visiting pharmacist, Einar Grad. She spent the hour providing education on the effects of drug on one's brain and other body parts. The guest speaker also identified the symptoms in early recovery from various chemicals and fielded questions from group members. A new group member was present for his first session.  A random drug test was also collected.   Group Time: 2:30-4 pm  Participation Level: Active  Behavioral Response: Sharing  Type of Therapy: Process Group  Interventions: Psycho-Social  Topic: Process: the second part of group was spent in process. Members checked-in with their sobriety dates and identified what they had done to support their recovery since the last group session. They also shared about any struggles or challenges that have appeared since we last met. During this half of group, the new group member introduced himself and provided a little history about his drug use. Another member shared about a very awkward situation he had put himself into on Monday night. This disclosure invited lively discussion and helpful feedback from his fellow group members.   Summary: The patient reported he had attended one [place] meeting since we last met. He shared about his trip to the magistrate on Monday evening after group. He had gone accompanied by his father and sponsor. The patient admitted he had gone into the bathroom and gotten on his knees and prayed to God before they walked in. The magistrate had been impressed by this fellow and read the letter he had been given from this counselor and opted to let him go.  The group applauded this news. The patient reported, "I thought about using" and "how I can't do anything right", but he realized that it wouldn't help. He asked several questions to the pharmacist and reported that her visit had proven very informative. After the walking meditation, the patient admitted he had noticed the paint job around [employer] tables was pretty "poor", but he recognized that he had found the blue paint on the court to be very warm and it felt good; it felt like water. The patient provided helpful feedback and noted that "I feel differently today than on Monday". The patient is making substantial progress and responded well to this intervention. He has a long history of addiction and has always manipulated and lied to others. Maintaining sobriety will require this man to be honest and open in a way he has never known before. The question is, when the legal issues end, will he continue to follow his daily recovery plan?  UDS collected: No Results: all previous drug tests have been negative  AA/NA attended?: YesMonday  Sponsor?: Yes   Brandon Melnick, [place] 10/04/2015 9:41 AM

## 2015-10-05 ENCOUNTER — Encounter (HOSPITAL_COMMUNITY): Payer: Self-pay | Admitting: Medical

## 2015-10-05 ENCOUNTER — Other Ambulatory Visit (HOSPITAL_COMMUNITY): Payer: 59 | Admitting: Psychology

## 2015-10-05 DIAGNOSIS — F1020 Alcohol dependence, uncomplicated: Secondary | ICD-10-CM

## 2015-10-05 DIAGNOSIS — F4310 Post-traumatic stress disorder, unspecified: Secondary | ICD-10-CM

## 2015-10-05 DIAGNOSIS — F1910 Other psychoactive substance abuse, uncomplicated: Secondary | ICD-10-CM

## 2015-10-05 DIAGNOSIS — F1520 Other stimulant dependence, uncomplicated: Secondary | ICD-10-CM

## 2015-10-05 DIAGNOSIS — F19980 Other psychoactive substance use, unspecified with psychoactive substance-induced anxiety disorder: Secondary | ICD-10-CM

## 2015-10-05 DIAGNOSIS — F1120 Opioid dependence, uncomplicated: Secondary | ICD-10-CM

## 2015-10-05 DIAGNOSIS — Z6372 Alcoholism and drug addiction in family: Secondary | ICD-10-CM

## 2015-10-05 DIAGNOSIS — F1994 Other psychoactive substance use, unspecified with psychoactive substance-induced mood disorder: Secondary | ICD-10-CM

## 2015-10-05 LAB — PRESCRIPTION ABUSE MONITORING 17P, URINE
6-Acetylmorphine, Urine: NEGATIVE ng/mL
Amphetamine Scrn, Ur: NEGATIVE ng/mL
BARBITURATE SCREEN URINE: NEGATIVE ng/mL
BENZODIAZEPINE SCREEN, URINE: NEGATIVE ng/mL
BUPRENORPHINE, URINE: NEGATIVE ng/mL
CANNABINOIDS UR QL SCN: NEGATIVE ng/mL
Carisoprodol/Meprobamate, Ur: NEGATIVE ng/mL
Cocaine (Metab) Scrn, Ur: NEGATIVE ng/mL
Creatinine(Crt), U: 49.9 mg/dL (ref 20.0–300.0)
EDDP, Urine: NEGATIVE ng/mL
FENTANYL, URINE: NEGATIVE pg/mL
MDMA Screen, Urine: NEGATIVE ng/mL
MEPERIDINE SCREEN, URINE: NEGATIVE ng/mL
METHADONE SCREEN, URINE: NEGATIVE ng/mL
NITRITE URINE, QUANTITATIVE: NEGATIVE ug/mL
OXYCODONE+OXYMORPHONE UR QL SCN: NEGATIVE ng/mL
Opiate Scrn, Ur: NEGATIVE ng/mL
Ph of Urine: 5.7 (ref 4.5–8.9)
Phencyclidine Qn, Ur: NEGATIVE ng/mL
Propoxyphene Scrn, Ur: NEGATIVE ng/mL
SPECIFIC GRAVITY: 1.01
Tapentadol, Urine: NEGATIVE ng/mL
Tramadol Screen, Urine: NEGATIVE ng/mL

## 2015-10-05 LAB — ETHYL GLUCURONIDE, URINE: Ethyl Glucuronide Screen, Ur: NEGATIVE ng/mL

## 2015-10-05 NOTE — Progress Notes (Signed)
  [facility] Intensive Outpatient Discharge Summary   Todd Cobb [id number]  Date of Admission: 07/26/2015 Date of Discharge: 10/06/2015  Course of Treatment: Following admission to CDIOP with sobriety date 07/12/15 pt successfully completed the requirements of the program and has engaged in the 12 Step recovery process.Pt was fully compliant with all aspects of program .   Goals and Activities to Help Maintain Sobriety: 1. Stay away from old friends who continue to drink and use mind-altering chemicals. 2. Continue practicing Fair Fighting rules in interpersonal conflicts. 3. Continue alcohol and drug refusal skills and call on support systems. 4. Keep followup appointments as scheduled.Be honest with providers.Avoid controlled substance prescription drugs>  Referrals: None   Medications: NONE  [facility] 5:15 pm with Todd Cobb LCAS 1. Attend AA/NA meetings at least 4 times per week. 2. Continue with sponsor and a home group in AA/NA. 3. Probation services  Next appointment: Aftercare  Plan of Action to Address Continuing Problems: as above    Client has participated in the development of this discharge plan and has received a copy of this completed plan  Todd Cobb  10/05/2015   Todd Mornharles Maddyx Vallie, PA-C 10/05/2015

## 2015-10-06 ENCOUNTER — Other Ambulatory Visit (HOSPITAL_COMMUNITY): Payer: 59 | Admitting: Psychology

## 2015-10-06 DIAGNOSIS — F1020 Alcohol dependence, uncomplicated: Secondary | ICD-10-CM

## 2015-10-06 DIAGNOSIS — F1520 Other stimulant dependence, uncomplicated: Secondary | ICD-10-CM

## 2015-10-06 DIAGNOSIS — F1120 Opioid dependence, uncomplicated: Secondary | ICD-10-CM

## 2015-10-06 NOTE — Progress Notes (Signed)
Todd Cobb is a 40 y.o. male patient. CD-IOP: Discharge Planning. Met with the patient at the conclusion of group today. He is scheduled to graduate this Thursday and we met to discuss his plans going forward. The patient is not currently taking any medications so he will not require any referral for psychiatrist needs. I have encouraged him to consider individual counseling to continue to address some of his childhood experiences and core beliefs that may have fueled his drug use in the past. The patient has explained his intention to secure employment and begin to contribute once again to the household finances. Because he lives in [place], [place] and his home group meets at 7 pm on Tuesday evenings, the patient did not find the After Care group to be a viable option at this time. It takes him almost 40 minutes to get here to the hospital from his home in [place] and he does not want to have to make that drive each week. He is very engaged in the [facility] and works closely with his sponsor. The patient is also on supervised probation for at least another year so he will have other sources of accountability in the days ahead. He has done very well and displays good insight and understanding about general recovery concepts as well as the specific needs he has and must follow to remain sober going forward. The Discharge Plan was completed and the session ended accordingly. The patient will return for 2 more group sessions and graduate on the 17th.          Brandon Melnick, LCAS

## 2015-10-07 NOTE — Progress Notes (Signed)
    Daily Group Progress Note  Program: CD-IOP   10/07/2015 DASH CARDARELLI [id number]  Diagnosis:  Severe alcohol use disorder (HCC)  Amphetamine and psychostimulant dependence, physiological dependence (HCC)  Severe opioid dependence ([place])   Sobriety Date: 07/12/15  Group Time: 1-2:30  Participation Level: Active  Behavioral Response: Appropriate and Sharing  Type of Therapy: Process Group  Interventions: Solution Focused  Topic: Counselors met with patients for group process session which focused on challenges in recovery. Patients were active and engaged. Counselor passed out the "feelings wheel" and ask each pt to identify their current feeling. One drug test was collected. One group member successfully graduated from the program. Counselors administered a daily self inventory, PHQ-9, and GAD-7 for all patients. One patient received an RAADS-14. Counselor showed a video about being open about recovery and addiction. A routine fire drill took place around 2pm and all members were cooperative and compliant with the plan of action.     Group Time: 2:45-4  Participation Level: Active  Behavioral Response: Appropriate and Sharing  Type of Therapy: Psycho-education Group  Interventions: Assertiveness Training  Topic: Counselors met with patients for group psychoeducation session which focused on deepening patient's understanding of refusal skills and how to avoid relapsing. Patients shared about their external triggers and the ways that they could potentially put themselves in high risk situations. All patients were active and engaged in session.   Summary: PATIENT DISCHARGED SUCCESSFULLY. Patient has completed the CD-IOP program with all negative UDS, full compliance with tx goals and requirements, and an openness and willingness to explore his past challenges and traumas. Patient shared about his family of origin, his addiction, past s/o relationships, and his  relationship with his children. Pt presented for his final group and was upbeat and excited for his future in recovery. Patient was receptive and grateful for feedback from group. A final PHQ9 and GAD7 screening revealed significantly reduced numbers, potentially indicating positive results and treatment effectiveness. Pt sponsor and father were present for 15 minutes of session during graduation portion. [name], Counselor   UDS collected: No Results: negative  AA/NA attended?: YesThursday  Sponsor?: Yes   Brandon Melnick, [place] 10/07/2015 11:42 AM

## 2015-10-09 ENCOUNTER — Encounter (HOSPITAL_COMMUNITY): Payer: Self-pay | Admitting: Psychology
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
## 2018-09-04 ENCOUNTER — Encounter (HOSPITAL_COMMUNITY): Payer: Self-pay | Admitting: Psychology

## 2023-04-05 ENCOUNTER — Other Ambulatory Visit (HOSPITAL_COMMUNITY): Payer: Self-pay

## 2023-06-26 ENCOUNTER — Encounter (HOSPITAL_BASED_OUTPATIENT_CLINIC_OR_DEPARTMENT_OTHER): Payer: Self-pay

## 2023-06-26 ENCOUNTER — Emergency Department (HOSPITAL_BASED_OUTPATIENT_CLINIC_OR_DEPARTMENT_OTHER)

## 2023-06-26 ENCOUNTER — Emergency Department (HOSPITAL_BASED_OUTPATIENT_CLINIC_OR_DEPARTMENT_OTHER)
Admission: EM | Admit: 2023-06-26 | Discharge: 2023-06-26 | Disposition: A | Discharge status: Home / Self Care | Attending: Emergency Medicine | Admitting: Emergency Medicine

## 2023-06-26 ENCOUNTER — Other Ambulatory Visit: Payer: Self-pay

## 2023-06-26 DIAGNOSIS — Z79899 Other long term (current) drug therapy: Secondary | ICD-10-CM | POA: Insufficient documentation

## 2023-06-26 DIAGNOSIS — I10 Essential (primary) hypertension: Secondary | ICD-10-CM | POA: Diagnosis not present

## 2023-06-26 DIAGNOSIS — R519 Headache, unspecified: Secondary | ICD-10-CM | POA: Diagnosis present

## 2023-06-26 LAB — CBC
HCT: 44.5 % (ref 39.0–52.0)
Hemoglobin: 15.3 g/dL (ref 13.0–17.0)
MCH: 29.4 pg (ref 26.0–34.0)
MCHC: 34.4 g/dL (ref 30.0–36.0)
MCV: 85.6 fL (ref 80.0–100.0)
Platelets: 225 10*3/uL (ref 150–400)
RBC: 5.2 MIL/uL (ref 4.22–5.81)
RDW: 13.4 % (ref 11.5–15.5)
WBC: 7.8 10*3/uL (ref 4.0–10.5)
nRBC: 0 % (ref 0.0–0.2)

## 2023-06-26 LAB — BASIC METABOLIC PANEL WITH GFR
Anion gap: 13 (ref 5–15)
BUN: 12 mg/dL (ref 6–20)
CO2: 23 mmol/L (ref 22–32)
Calcium: 9.4 mg/dL (ref 8.9–10.3)
Chloride: 102 mmol/L (ref 98–111)
Creatinine, Ser: 0.8 mg/dL (ref 0.61–1.24)
GFR, Estimated: >60 mL/min (ref >60)
Glucose, Bld: 86 mg/dL (ref 70–99)
Potassium: 4 mmol/L (ref 3.5–5.1)
Sodium: 137 mmol/L (ref 135–145)

## 2023-06-26 LAB — TROPONIN T, HIGH SENSITIVITY: Troponin T High Sensitivity: <15 ng/L (ref <19)

## 2023-06-26 MED ORDER — DIPHENHYDRAMINE HCL 50 MG/ML IJ SOLN: 25.0000 mg | Freq: Once | INTRAMUSCULAR | Status: DC

## 2023-06-26 MED ORDER — AMLODIPINE BESYLATE 5 MG PO TABS: 5.0000 mg | ORAL_TABLET | Freq: Every day | ORAL | 0 refills | Status: AC

## 2023-06-26 MED ORDER — AMLODIPINE BESYLATE 5 MG PO TABS
5.0000 mg | ORAL_TABLET | Freq: Once | ORAL | Status: AC
  Administered 2023-06-26: 5 mg via ORAL
  Filled 2023-06-26: qty 1

## 2023-06-26 NOTE — ED Triage Notes (Signed)
 Pt states that over the past few days he has been having high blood pressure at home between 150-200 systolic, is not on hypertensive meds. Checked it the other day because he had a headache.

## 2023-06-26 NOTE — ED Provider Notes (Signed)
 [place] EMERGENCY DEPARTMENT AT [facility] Provider Note   CSN: [id number] Arrival date & time: 06/26/23  1914     History  Chief Complaint  Patient presents with   Hypertension    Todd Cobb is a 48 y.o. male history of subdural hematoma, osteomyelitis presented for headache and hypertension since Monday.  Patient states his blood pressures been in the 180s over 120s and that he is not currently on blood pressure meds.  Patient states he does have some shortness of breath but denies chest pain.  Patient does smoke 3 cigarettes daily and thinks that this is causing his shortness of breath.  Patient denies any new onset weakness, paresthesias, falls, vision changes.  Home Medications Prior to Admission medications   Medication Sig Start Date End Date Taking? Authorizing Provider  amLODipine (NORVASC) 5 MG tablet Take 1 tablet (5 mg total) by mouth daily. 06/26/23 07/26/23 Yes Genny Caulder, Arlin Benes, PA-C  ALPRAZolam (XANAX) 0.5 MG tablet Take 0.5 mg by mouth. Reported on 09/06/2015 08/17/14   [provider]  amphetamine -dextroamphetamine  (ADDERALL XR, 20MG ,) 20 MG 24 hr capsule Take 1 capsule (20 mg total) by mouth every morning. Patient not taking: Reported on 07/27/2015 03/08/11 05/03/16  Marybelle Smiling, MD  amphetamine -dextroamphetamine  (ADDERALL XR, 20MG ,) 20 MG 24 hr capsule Take 1 capsule (20 mg total) by mouth every morning. Patient not taking: Reported on 07/27/2015 03/08/11 05/03/16  Marybelle Smiling, MD  amphetamine -dextroamphetamine  (ADDERALL XR, 20MG ,) 20 MG 24 hr capsule Take 1 capsule (20 mg total) by mouth every morning. Patient not taking: Reported on 07/27/2015 06/04/11 05/03/16  Hillery Lown, MD  amphetamine -dextroamphetamine  (ADDERALL XR, 20MG ,) 20 MG 24 hr capsule Take 1 capsule (20 mg total) by mouth every morning. Patient not taking: Reported on 07/27/2015 06/04/11 05/03/16  Hillery Lown, MD  amphetamine -dextroamphetamine  (ADDERALL XR, 20MG ,) 20 MG 24  hr capsule Take 1 capsule (20 mg total) by mouth every morning. Patient not taking: Reported on 07/27/2015 06/04/11 05/03/16  Hillery Lown, MD  amphetamine -dextroamphetamine  (ADDERALL) 20 MG tablet Take 1 tablet (20 mg total) by mouth 3 (three) times daily. Fill in two months Patient not taking: Reported on 07/27/2015 05/04/15   Marybelle Smiling, MD  amphetamine -dextroamphetamine  (ADDERALL) 20 MG tablet Take 1 tablet (20 mg total) by mouth 3 (three) times daily. Patient not taking: Reported on 07/27/2015 05/04/15   Marybelle Smiling, MD  amphetamine -dextroamphetamine  (ADDERALL) 20 MG tablet Take 1 tablet (20 mg total) by mouth 3 (three) times daily. Fill in one month Patient not taking: Reported on 07/27/2015 05/04/15   Marybelle Smiling, MD  cyclobenzaprine (FLEXERIL) 10 MG tablet Take 10 mg by mouth 3 (three) times daily as needed. Reported on 09/06/2015    [provider]  HYDROcodone -acetaminophen (NORCO/VICODIN) 5-325 MG tablet Take by mouth. Reported on 09/06/2015 08/17/14   [provider]  HYDROcodone -homatropine (HYCODAN) 5-1.5 MG/5ML syrup Take 5 mLs by mouth every 4 (four) hours as needed for cough. Patient not taking: Reported on 07/27/2015 04/17/13   Reginal Capra, MD  methylphenidate  (RITALIN ) 20 MG tablet Take 20 mg by mouth 2 (two) times daily. Reported on 09/06/2015    [provider]      Allergies    Patient has no known allergies.    Review of Systems   Review of Systems  Physical Exam Updated Vital Signs BP (!) 154/104   Pulse 63   Temp 97.9 F (36.6 C)   Resp 15  Ht 6' (1.829 m)   Wt 113.4 kg   SpO2 97%   BMI 33.91 kg/m  Physical Exam Vitals reviewed.  Constitutional:      General: He is not in acute distress. HENT:     Head: Normocephalic and atraumatic.  Eyes:     Extraocular Movements: Extraocular movements intact.     Conjunctiva/sclera: Conjunctivae normal.     Pupils: Pupils are equal, round, and reactive to light.  Cardiovascular:      Rate and Rhythm: Normal rate and regular rhythm.     Pulses: Normal pulses.     Heart sounds: Normal heart sounds.     Comments: 2+ bilateral radial/dorsalis pedis pulses with regular rate Pulmonary:     Effort: Pulmonary effort is normal. No respiratory distress.     Breath sounds: Normal breath sounds.  Abdominal:     Palpations: Abdomen is soft.     Tenderness: There is no abdominal tenderness. There is no guarding or rebound.  Musculoskeletal:        General: Normal range of motion.     Cervical back: Normal range of motion and neck supple.     Comments: 5 out of 5 bilateral grip/leg extension strength  Skin:    General: Skin is warm and dry.     Capillary Refill: Capillary refill takes less than 2 seconds.  Neurological:     General: No focal deficit present.     Mental Status: He is alert and oriented to person, place, and time.     Sensory: Sensation is intact.     Motor: Motor function is intact.     Coordination: Coordination is intact.     Gait: Gait is intact.     Comments: Sensation intact in all 4 limbs Vision grossly intact Cranial nerves II through XII intact  Psychiatric:        Mood and Affect: Mood normal.    ED Results / Procedures / Treatments   Labs (all labs ordered are listed, but only abnormal results are displayed) Labs Reviewed  CBC  BASIC METABOLIC PANEL WITH GFR  TROPONIN T, HIGH SENSITIVITY    EKG None  Radiology CT Head Wo Contrast Result Date: 06/26/2023 CLINICAL DATA:  Hypertension and headache. EXAM: CT HEAD WITHOUT CONTRAST TECHNIQUE: Contiguous axial images were obtained from the base of the skull through the vertex without intravenous contrast. RADIATION DOSE REDUCTION: This exam was performed according to the departmental dose-optimization program which includes automated exposure control, adjustment of the mA and/or kV according to patient size and/or use of iterative reconstruction technique. COMPARISON:  None Available.  FINDINGS: Brain: No intracranial hemorrhage, mass effect, or evidence of acute infarct. No hydrocephalus. No extra-axial fluid collection. Vascular: No hyperdense vessel or unexpected calcification. Skull: No fracture or focal lesion. Sinuses/Orbits: No acute finding. Other: None. IMPRESSION: No acute intracranial abnormality. Electronically Signed   By: Rozell Cornet M.D.   On: 06/26/2023 22:31   DG Chest Port 1 View Result Date: 06/26/2023 CLINICAL DATA:  Shortness of breath EXAM: PORTABLE CHEST 1 VIEW COMPARISON:  None Available. FINDINGS: The heart size and mediastinal contours are within normal limits. Both lungs are clear. The visualized skeletal structures are unremarkable. IMPRESSION: No active disease. Electronically Signed   By: Rozell Cornet M.D.   On: 06/26/2023 22:23    Procedures Procedures    Medications Ordered in ED Medications  amLODipine (NORVASC) tablet 5 mg (has no administration in time range)    ED Course/ Medical Decision Making/  A&P                                 Medical Decision Making Amount and/or Complexity of Data Reviewed Labs: ordered. Radiology: ordered.  Risk Prescription drug management.   Todd Cobb 48 y.o. presented today for elevated HTN. Working DDx that I considered at this time includes, but not limited to, HTN urgency/emergency, intracranial hemorrhage, acute renal artery stenosis, acute kidney injury, ACS, ophthalmologic emergencies.  R/o DDx: HTN urgency/emergency, intracranial hemorrhage, acute renal artery stenosis, acute kidney injury, ACS, ophthalmologic emergencies: These are considered less likely due to history of present illness, physical exam, labs/imaging findings  Review of prior external notes: 07/27/2022 office visit  Unique Tests and My Independent Interpretation:  CBC: Unremarkable BMP: Unremarkable Troponin: Unremarkable EKG: Rate, rhythm, axis, intervals all examined and without medically relevant abnormality.  ST segments without concerns for elevations CXR: Unremarkable CT Head w/o Contrast: Unremarkable  Social Determinants of Health: none  Discussion with Independent Historian: Father  Discussion of Management of Tests: None  Risk: Medium: prescription drug management  Risk Stratification Score: None  Plan: On exam patient was in no acute distress. Physical exam showed no acute findings. Will obtain labs to rule out end organ damage. Patient stable at this time.  Labs and imaging do not show any signs of endorgan damage.  Patient is also not endorsing chest pain, shortness of breath, AMS, blurred vision or have AKI or neuro deficit.  At this time do believe patient is having asymptomatic hypertension and will need to follow-up with their primary care provider to discuss their blood pressure medications.  Do not want to reduce blood pressure here in the ED as this may cause a stroke which was discussed with the patient.  After discussion with the patient he will like to be started on amlodipine so we will give 1 dose here and prescribe the rest and have him follow-up close with his primary care provider.  Patient was given return precautions. Patient stable for discharge at this time.  Patient verbalized understanding of plan.  This chart was dictated using voice recognition software.  Despite best efforts to proofread,  errors can occur which can change the documentation meaning.         Final Clinical Impression(s) / ED Diagnoses Final diagnoses:  Uncontrolled hypertension    Rx / DC Orders ED Discharge Orders          Ordered    amLODipine (NORVASC) 5 MG tablet  Daily        06/26/23 2247              Denese Finn, PA-C 06/26/23 2308    Scarlette Currier, MD 06/27/23 1402

## 2023-06-26 NOTE — Discharge Instructions (Addendum)
 Please follow up with your primary care provider in regards to recent ER visit and symptoms. Today we evaluated you for your blood pressure and thankfully there does not appear to be any abnormalities that would necessitate further evaluation or admission. As we discussed, with your labs and imaging being reassuring, we did not acutely drop your blood pressure as this may do more harm than good, such as causing a stroke. We recommend you follow up closely with your primary care provider to have your blood pressure rechecked and evaluate what medication changes need to be made. If we started you on a blood pressure medication today in the ER, please follow the directions closely and take as prescribed. Being on blood pressure medications might make you lightheaded or dizzy if they are too strong so please monitor yourself for any side effects. Please take your blood pressure daily and record these readings for your primary care provider. It may take up to 6 weeks before you see improvement in your blood pressure so please do not be discouraged and continue taking your blood pressure medications. If you have blood pressure readings greater than 180/110, vision changes, headaches, chest pain, shortness of breath, inability to urinate, or other changes or worsening symptoms, please return to the ER.
